# Patient Record
Sex: Female | Born: 1965 | Race: White | Hispanic: No | Marital: Married | Smoking: Never smoker
Health system: Southern US, Community
[De-identification: ages and names within clinical notes are randomized; demographics above are authoritative.]

## PROBLEM LIST (undated history)

## (undated) DIAGNOSIS — G43909 Migraine, unspecified, not intractable, without status migrainosus: Secondary | ICD-10-CM

## (undated) DIAGNOSIS — I10 Essential (primary) hypertension: Secondary | ICD-10-CM

## (undated) DIAGNOSIS — Z8719 Personal history of other diseases of the digestive system: Secondary | ICD-10-CM

## (undated) DIAGNOSIS — F419 Anxiety disorder, unspecified: Secondary | ICD-10-CM

## (undated) DIAGNOSIS — E785 Hyperlipidemia, unspecified: Secondary | ICD-10-CM

## (undated) DIAGNOSIS — K219 Gastro-esophageal reflux disease without esophagitis: Secondary | ICD-10-CM

## (undated) HISTORY — PX: ESOPHAGOGASTRODUODENOSCOPY: SHX1529

## (undated) HISTORY — PX: LAPAROSCOPIC HYSTERECTOMY: SHX1926

---

## 2006-05-21 ENCOUNTER — Emergency Department (HOSPITAL_COMMUNITY): Admission: EM | Admit: 2006-05-21 | Discharge: 2006-05-21 | Payer: Self-pay | Admitting: Family Medicine

## 2006-06-19 ENCOUNTER — Emergency Department (HOSPITAL_COMMUNITY): Admission: EM | Admit: 2006-06-19 | Discharge: 2006-06-19 | Payer: Self-pay | Admitting: Emergency Medicine

## 2006-06-21 ENCOUNTER — Emergency Department (HOSPITAL_COMMUNITY): Admission: EM | Admit: 2006-06-21 | Discharge: 2006-06-21 | Payer: Self-pay | Admitting: Family Medicine

## 2006-07-30 ENCOUNTER — Emergency Department (HOSPITAL_COMMUNITY): Admission: EM | Admit: 2006-07-30 | Discharge: 2006-07-30 | Payer: Self-pay | Admitting: Family Medicine

## 2007-03-16 DIAGNOSIS — I10 Essential (primary) hypertension: Secondary | ICD-10-CM | POA: Insufficient documentation

## 2008-02-09 ENCOUNTER — Emergency Department (HOSPITAL_COMMUNITY): Admission: EM | Admit: 2008-02-09 | Discharge: 2008-02-09 | Payer: Self-pay | Admitting: Emergency Medicine

## 2009-07-12 ENCOUNTER — Encounter: Admission: RE | Admit: 2009-07-12 | Discharge: 2009-07-12 | Payer: Self-pay | Admitting: Family Medicine

## 2009-08-03 ENCOUNTER — Ambulatory Visit (HOSPITAL_COMMUNITY): Admission: RE | Admit: 2009-08-03 | Discharge: 2009-08-03 | Payer: Self-pay | Admitting: Obstetrics & Gynecology

## 2010-12-18 LAB — TYPE AND SCREEN: Antibody Screen: NEGATIVE

## 2010-12-18 LAB — CBC
HCT: 33.5 % — ABNORMAL LOW (ref 36.0–46.0)
Hemoglobin: 11 g/dL — ABNORMAL LOW (ref 12.0–15.0)
MCV: 76 fL — ABNORMAL LOW (ref 78.0–100.0)
Platelets: 284 10*3/uL (ref 150–400)
RDW: 16.9 % — ABNORMAL HIGH (ref 11.5–15.5)
WBC: 5.8 10*3/uL (ref 4.0–10.5)

## 2010-12-18 LAB — BASIC METABOLIC PANEL
BUN: 10 mg/dL (ref 6–23)
Chloride: 100 mEq/L (ref 96–112)
Glucose, Bld: 100 mg/dL — ABNORMAL HIGH (ref 70–99)
Potassium: 3.7 mEq/L (ref 3.5–5.1)
Sodium: 135 mEq/L (ref 135–145)

## 2011-03-24 ENCOUNTER — Other Ambulatory Visit: Payer: Self-pay | Admitting: Physician Assistant

## 2011-03-24 ENCOUNTER — Other Ambulatory Visit (HOSPITAL_COMMUNITY)
Admission: RE | Admit: 2011-03-24 | Discharge: 2011-03-24 | Disposition: A | Discharge status: Home / Self Care | Payer: BC Managed Care – PPO | Source: Ambulatory Visit | Attending: Family Medicine | Admitting: Family Medicine

## 2011-03-24 DIAGNOSIS — Z0000 Encounter for general adult medical examination without abnormal findings: Secondary | ICD-10-CM | POA: Insufficient documentation

## 2011-04-21 ENCOUNTER — Other Ambulatory Visit: Payer: Self-pay | Admitting: Physician Assistant

## 2011-06-11 LAB — POCT I-STAT, CHEM 8
BUN: 10
Calcium, Ion: 1.14
Chloride: 106
Creatinine, Ser: 0.9
Glucose, Bld: 83
Potassium: 3.8

## 2011-06-11 LAB — CBC
Hemoglobin: 12.7
MCHC: 33.6
RBC: 4.54
WBC: 7.4

## 2011-06-11 LAB — DIFFERENTIAL
Basophils Relative: 0
Lymphs Abs: 1.4
Monocytes Absolute: 0.3
Monocytes Relative: 5
Neutro Abs: 5.5
Neutrophils Relative %: 74

## 2014-12-18 ENCOUNTER — Emergency Department (HOSPITAL_COMMUNITY): Payer: BC Managed Care – PPO

## 2014-12-18 ENCOUNTER — Emergency Department (HOSPITAL_COMMUNITY)
Admission: EM | Admit: 2014-12-18 | Discharge: 2014-12-18 | Disposition: A | Discharge status: Home / Self Care | Payer: BC Managed Care – PPO | Attending: Emergency Medicine | Admitting: Emergency Medicine

## 2014-12-18 ENCOUNTER — Encounter (HOSPITAL_COMMUNITY): Payer: Self-pay | Admitting: *Deleted

## 2014-12-18 DIAGNOSIS — Z79899 Other long term (current) drug therapy: Secondary | ICD-10-CM | POA: Diagnosis not present

## 2014-12-18 DIAGNOSIS — R51 Headache: Secondary | ICD-10-CM | POA: Diagnosis present

## 2014-12-18 DIAGNOSIS — E785 Hyperlipidemia, unspecified: Secondary | ICD-10-CM | POA: Diagnosis not present

## 2014-12-18 DIAGNOSIS — I10 Essential (primary) hypertension: Secondary | ICD-10-CM | POA: Diagnosis not present

## 2014-12-18 DIAGNOSIS — G43809 Other migraine, not intractable, without status migrainosus: Secondary | ICD-10-CM | POA: Diagnosis not present

## 2014-12-18 DIAGNOSIS — G43909 Migraine, unspecified, not intractable, without status migrainosus: Secondary | ICD-10-CM | POA: Diagnosis not present

## 2014-12-18 DIAGNOSIS — F419 Anxiety disorder, unspecified: Secondary | ICD-10-CM | POA: Diagnosis not present

## 2014-12-18 HISTORY — DX: Migraine, unspecified, not intractable, without status migrainosus: G43.909

## 2014-12-18 HISTORY — DX: Hyperlipidemia, unspecified: E78.5

## 2014-12-18 HISTORY — DX: Essential (primary) hypertension: I10

## 2014-12-18 HISTORY — DX: Anxiety disorder, unspecified: F41.9

## 2014-12-18 LAB — COMPREHENSIVE METABOLIC PANEL
ALT: 25 U/L (ref 0–35)
ANION GAP: 9 (ref 5–15)
AST: 18 U/L (ref 0–37)
Albumin: 4.5 g/dL (ref 3.5–5.2)
Alkaline Phosphatase: 90 U/L (ref 39–117)
BUN: 18 mg/dL (ref 6–23)
CALCIUM: 9.4 mg/dL (ref 8.4–10.5)
CO2: 29 mmol/L (ref 19–32)
CREATININE: 0.87 mg/dL (ref 0.50–1.10)
Chloride: 100 mmol/L (ref 96–112)
GFR calc Af Amer: >90 mL/min (ref >90)
GFR calc non Af Amer: 78 mL/min — ABNORMAL LOW (ref >90)
Glucose, Bld: 93 mg/dL (ref 70–99)
Potassium: 3.3 mmol/L — ABNORMAL LOW (ref 3.5–5.1)
Sodium: 138 mmol/L (ref 135–145)
Total Bilirubin: 0.5 mg/dL (ref 0.3–1.2)
Total Protein: 7.6 g/dL (ref 6.0–8.3)

## 2014-12-18 LAB — CBG MONITORING, ED
Glucose-Capillary: 80 mg/dL (ref 70–99)
Glucose-Capillary: 89 mg/dL (ref 70–99)

## 2014-12-18 LAB — PROTIME-INR
INR: 0.94 (ref 0.00–1.49)
Prothrombin Time: 12.7 seconds (ref 11.6–15.2)

## 2014-12-18 LAB — CBC
HCT: 43.3 % (ref 36.0–46.0)
HEMOGLOBIN: 14.8 g/dL (ref 12.0–15.0)
MCH: 29.2 pg (ref 26.0–34.0)
MCHC: 34.2 g/dL (ref 30.0–36.0)
MCV: 85.4 fL (ref 78.0–100.0)
Platelets: 227 10*3/uL (ref 150–400)
RBC: 5.07 MIL/uL (ref 3.87–5.11)
RDW: 12.9 % (ref 11.5–15.5)
WBC: 7.5 10*3/uL (ref 4.0–10.5)

## 2014-12-18 LAB — APTT: APTT: 29 s (ref 24–37)

## 2014-12-18 LAB — DIFFERENTIAL
Basophils Absolute: 0 10*3/uL (ref 0.0–0.1)
Basophils Relative: 0 % (ref 0–1)
Eosinophils Absolute: 0.2 10*3/uL (ref 0.0–0.7)
Eosinophils Relative: 3 % (ref 0–5)
Lymphocytes Relative: 30 % (ref 12–46)
Lymphs Abs: 2.3 10*3/uL (ref 0.7–4.0)
MONO ABS: 0.4 10*3/uL (ref 0.1–1.0)
Monocytes Relative: 5 % (ref 3–12)
Neutro Abs: 4.7 10*3/uL (ref 1.7–7.7)
Neutrophils Relative %: 62 % (ref 43–77)

## 2014-12-18 LAB — I-STAT TROPONIN, ED: Troponin i, poc: 0 ng/mL (ref 0.00–0.08)

## 2014-12-18 MED ORDER — MORPHINE SULFATE 4 MG/ML IJ SOLN
4.0000 mg | Freq: Once | INTRAMUSCULAR | Status: AC
  Administered 2014-12-18: 4 mg via INTRAVENOUS
  Filled 2014-12-18 (×2): qty 1

## 2014-12-18 MED ORDER — GADOBENATE DIMEGLUMINE 529 MG/ML IV SOLN
20.0000 mL | Freq: Once | INTRAVENOUS | Status: AC | PRN
  Administered 2014-12-18: 20 mL via INTRAVENOUS

## 2014-12-18 MED ORDER — DIPHENHYDRAMINE HCL 50 MG/ML IJ SOLN
25.0000 mg | Freq: Once | INTRAMUSCULAR | Status: AC
  Administered 2014-12-18: 25 mg via INTRAVENOUS
  Filled 2014-12-18: qty 1

## 2014-12-18 MED ORDER — SODIUM CHLORIDE 0.9 % IV BOLUS (SEPSIS)
1000.0000 mL | Freq: Once | INTRAVENOUS | Status: AC
  Administered 2014-12-18: 1000 mL via INTRAVENOUS

## 2014-12-18 MED ORDER — PROCHLORPERAZINE EDISYLATE 5 MG/ML IJ SOLN
10.0000 mg | Freq: Once | INTRAMUSCULAR | Status: AC
  Administered 2014-12-18: 10 mg via INTRAVENOUS
  Filled 2014-12-18: qty 2

## 2014-12-18 MED ORDER — KETOROLAC TROMETHAMINE 30 MG/ML IJ SOLN
30.0000 mg | Freq: Once | INTRAMUSCULAR | Status: AC
  Administered 2014-12-18: 30 mg via INTRAVENOUS
  Filled 2014-12-18: qty 1

## 2014-12-18 MED ORDER — METOCLOPRAMIDE HCL 5 MG/ML IJ SOLN
10.0000 mg | Freq: Once | INTRAMUSCULAR | Status: AC
  Administered 2014-12-18: 10 mg via INTRAVENOUS
  Filled 2014-12-18: qty 2

## 2014-12-18 MED ORDER — LABETALOL HCL 5 MG/ML IV SOLN
10.0000 mg | Freq: Once | INTRAVENOUS | Status: AC
  Administered 2014-12-18: 10 mg via INTRAVENOUS
  Filled 2014-12-18: qty 4

## 2014-12-18 NOTE — ED Notes (Signed)
Code stroke called

## 2014-12-18 NOTE — ED Notes (Signed)
LSN [id number]

## 2014-12-18 NOTE — Discharge Instructions (Signed)
Your MRI showed some changes that can be seen with pseudotumor cerebri. Please follow up with your Neurologist as scheduled.     Hypertension Hypertension, commonly called high blood pressure, is when the force of blood pumping through your arteries is too strong. Your arteries are the blood vessels that carry blood from your heart throughout your body. A blood pressure reading consists of a higher number over a lower number, such as 110/72. The higher number (systolic) is the pressure inside your arteries when your heart pumps. The lower number (diastolic) is the pressure inside your arteries when your heart relaxes. Ideally you want your blood pressure below 120/80. Hypertension forces your heart to work harder to pump blood. Your arteries may become narrow or stiff. Having hypertension puts you at risk for heart disease, stroke, and other problems.  RISK FACTORS Some risk factors for high blood pressure are controllable. Others are not.  Risk factors you cannot control include:   Race. You may be at higher risk if you are African American.  Age. Risk increases with age.  Gender. Men are at higher risk than women before age 38 years. After age 62, women are at higher risk than men. Risk factors you can control include:  Not getting enough exercise or physical activity.  Being overweight.  Getting too much fat, sugar, calories, or salt in your diet.  Drinking too much alcohol. SIGNS AND SYMPTOMS Hypertension does not usually cause signs or symptoms. Extremely high blood pressure (hypertensive crisis) may cause headache, anxiety, shortness of breath, and nosebleed. DIAGNOSIS  To check if you have hypertension, your health care provider will measure your blood pressure while you are seated, with your arm held at the level of your heart. It should be measured at least twice using the same arm. Certain conditions can cause a difference in blood pressure between your right and left arms. A  blood pressure reading that is higher than normal on one occasion does not mean that you need treatment. If one blood pressure reading is high, ask your health care provider about having it checked again. TREATMENT  Treating high blood pressure includes making lifestyle changes and possibly taking medicine. Living a healthy lifestyle can help lower high blood pressure. You may need to change some of your habits. Lifestyle changes may include:  Following the DASH diet. This diet is high in fruits, vegetables, and whole grains. It is low in salt, red meat, and added sugars.  Getting at least 2 hours of brisk physical activity every week.  Losing weight if necessary.  Not smoking.  Limiting alcoholic beverages.  Learning ways to reduce stress. If lifestyle changes are not enough to get your blood pressure under control, your health care provider may prescribe medicine. You may need to take more than one. Work closely with your health care provider to understand the risks and benefits. HOME CARE INSTRUCTIONS  Have your blood pressure rechecked as directed by your health care provider.   Take medicines only as directed by your health care provider. Follow the directions carefully. Blood pressure medicines must be taken as prescribed. The medicine does not work as well when you skip doses. Skipping doses also puts you at risk for problems.   Do not smoke.   Monitor your blood pressure at home as directed by your health care provider. SEEK MEDICAL CARE IF:   You think you are having a reaction to medicines taken.  You have recurrent headaches or feel dizzy.  You have  swelling in your ankles.  You have trouble with your vision. SEEK IMMEDIATE MEDICAL CARE IF:  You develop a severe headache or confusion.  You have unusual weakness, numbness, or feel faint.  You have severe chest or abdominal pain.  You vomit repeatedly.  You have trouble breathing. MAKE SURE YOU:    Understand these instructions.  Will watch your condition.  Will get help right away if you are not doing well or get worse. Document Released: 09/01/2005 Document Revised: 01/16/2014 Document Reviewed: 06/24/2013 [facility] Patient Information 2015 [place], [place]. This information is not intended to replace advice given to you by your health care provider. Make sure you discuss any questions you have with your health care provider.  Migraine Headache A migraine headache is an intense, throbbing pain on one or both sides of your head. A migraine can last for 30 minutes to several hours. CAUSES  The exact cause of a migraine headache is not always known. However, a migraine may be caused when nerves in the brain become irritated and release chemicals that cause inflammation. This causes pain. Certain things may also trigger migraines, such as:  Alcohol.  Smoking.  Stress.  Menstruation.  Aged cheeses.  Foods or drinks that contain nitrates, glutamate, aspartame, or tyramine.  Lack of sleep.  Chocolate.  Caffeine.  Hunger.  Physical exertion.  Fatigue.  Medicines used to treat chest pain (nitroglycerine), birth control pills, estrogen, and some blood pressure medicines. SIGNS AND SYMPTOMS  Pain on one or both sides of your head.  Pulsating or throbbing pain.  Severe pain that prevents daily activities.  Pain that is aggravated by any physical activity.  Nausea, vomiting, or both.  Dizziness.  Pain with exposure to bright lights, loud noises, or activity.  General sensitivity to bright lights, loud noises, or smells. Before you get a migraine, you may get warning signs that a migraine is coming (aura). An aura may include:  Seeing flashing lights.  Seeing bright spots, halos, or zigzag lines.  Having tunnel vision or blurred vision.  Having feelings of numbness or tingling.  Having trouble talking.  Having muscle weakness. DIAGNOSIS  A migraine  headache is often diagnosed based on:  Symptoms.  Physical exam.  A CT scan or MRI of your head. These imaging tests cannot diagnose migraines, but they can help rule out other causes of headaches. TREATMENT Medicines may be given for pain and nausea. Medicines can also be given to help prevent recurrent migraines.  HOME CARE INSTRUCTIONS  Only take over-the-counter or prescription medicines for pain or discomfort as directed by your health care provider. The use of long-term narcotics is not recommended.  Lie down in a dark, quiet room when you have a migraine.  Keep a journal to find out what may trigger your migraine headaches. For example, write down:  What you eat and drink.  How much sleep you get.  Any change to your diet or medicines.  Limit alcohol consumption.  Quit smoking if you smoke.  Get 7-9 hours of sleep, or as recommended by your health care provider.  Limit stress.  Keep lights dim if bright lights bother you and make your migraines worse. SEEK IMMEDIATE MEDICAL CARE IF:   Your migraine becomes severe.  You have a fever.  You have a stiff neck.  You have vision loss.  You have muscular weakness or loss of muscle control.  You start losing your balance or have trouble walking.  You feel faint or pass out.  You have severe symptoms that are different from your first symptoms. MAKE SURE YOU:   Understand these instructions.  Will watch your condition.  Will get help right away if you are not doing well or get worse. Document Released: 09/01/2005 Document Revised: 01/16/2014 Document Reviewed: 05/09/2013 [facility] Patient Information 2015 [place], [place]. This information is not intended to replace advice given to you by your health care provider. Make sure you discuss any questions you have with your health care provider. Pseudotumor Cerebri Pseudotumor cerebri, also called idiopathic intracranial hypertension, is a condition that occurs due  to increased pressure within your skull. Although some of the symptoms resemble those of a brain tumor, it is not a brain tumor. Symptoms occur when the increased pressure in your skull compresses brain structures. For example, pressure on the nerve responsible for vision (optic nerve) causes it to swell, resulting in visual symptoms. Pseudotumor cerebri tends to occur in obese women younger than 47 years of age. However, men and children can also develop this condition. SYMPTOMS  Symptoms of pseudotumor cerebri occur due to increased pressure within the skull. Symptoms may include:   Headaches.  Nausea and vomiting.  Dizziness.  High blood pressure.   Ringing in the ears.  Double or blurred vision.  Brief episodes of complete loss of vision.  Pain in the back, neck, or shoulders. DIAGNOSIS  Pseudotumor cerebri is diagnosed through:  A detailed eye exam, which can reveal a swollen optic nerve, as well as identifying issues such as blind spots in the vision.  An MRI or CT scan to rule out other disorders that can cause similar symptoms, such as brain tumors.  A spinal tap (lumbar puncture), which can demonstrate increased pressure within the skull. TREATMENT  There are several ways that pseudotumor cerebri is treated, including:  Medicines to decrease the production of spinal fluid and lower the pressure within your skull.  Medicines to prevent or treat headaches.  Surgery to create an opening in your optic nerve to allow excess fluid to drain out.  Surgery to place drains (shunts) in your brain to remove excess fluid. HOME CARE INSTRUCTIONS   Take all medicines as directed by your health care provider.  Go to all of your follow-up appointments.  Lose weight if you are overweight. SEEK MEDICAL CARE IF:  Any symptoms come back.  You develop trouble with hearing, vision, balance, or your sense of smell.  You cannot eat or drink what you need.  You are more weak or  tired than usual.   You are losing weight without trying. SEEK IMMEDIATE MEDICAL CARE IF:  You have new symptoms such as vision problems or difficulty walking.   You have a seizure.   You have trouble breathing.   You have a fever.  Document Released: 09/06/2013 Document Reviewed: 09/06/2013 [facility] Patient Information 2015 [place], [place]. This information is not intended to replace advice given to you by your health care provider. Make sure you discuss any questions you have with your health care provider.

## 2014-12-18 NOTE — ED Provider Notes (Signed)
CSN: [id number]     Arrival date & time 12/18/14  83 History   First MD Initiated Contact with Patient 12/18/14 1436     Chief Complaint  Patient presents with  . Hypertension  . Altered Mental Status   Level V caveat: Alerted mental status.   Cynthia Tapia is a 49 y.o. female with a history of migraines, hypertension, anxiety and hyperlipidemia who presents to the ED initially complaining of a bad headache and high blood pressure. When I initially evaluated the patient her speech is garbled and she is unable to follow commands. She would not raise her arms. Her husband reports that she has had elevated blood pressures over the past 4 days and after arrival to the ED started not acting herself. He reports she has a history of migraines but has never had symptoms this bad before. She is complaining of her head hurting.  (Consider location/radiation/quality/duration/timing/severity/associated sxs/prior Treatment) HPI  Past Medical History  Diagnosis Date  . Migraine   . Hypertension   . Anxiety   . Hyperlipidemia    Past Surgical History  Procedure Laterality Date  . Abdominal hysterectomy      partial  . Cesarean section     Family History  Problem Relation Age of Onset  . Migraines Mother    History  Substance Use Topics  . Smoking status: Never Smoker   . Smokeless tobacco: Not on file  . Alcohol Use: Yes     Comment: occ   OB History    No data available     Review of Systems  Unable to perform ROS: Mental status change      Allergies  Review of patient's allergies indicates no known allergies.  Home Medications   Prior to Admission medications   Medication Sig Start Date End Date Taking? Authorizing Provider  baclofen (LIORESAL) 10 MG tablet Take 10 mg by mouth 3 (three) times daily as needed (headache).  12/14/14  Yes Historical Provider, MD  Cholecalciferol (VITAMIN D PO) Take 1 tablet by mouth daily.   Yes Historical Provider, MD  clonazePAM (KLONOPIN)  1 MG tablet Take 1 mg by mouth 2 (two) times daily.  12/15/14  Yes Historical Provider, MD  losartan-hydrochlorothiazide (HYZAAR) 100-12.5 MG per tablet Take 1 tablet by mouth daily. 09/26/14  Yes Historical Provider, MD  metoprolol succinate (TOPROL-XL) 50 MG 24 hr tablet Take 50 mg by mouth daily. 12/15/14  Yes Historical Provider, MD  Multiple Vitamins-Minerals (PRESERVISION AREDS 2 PO) Take 1 tablet by mouth 2 (two) times daily.   Yes Historical Provider, MD  Multiple Vitamins-Minerals (ZINC PO) Take 1 tablet by mouth daily.   Yes Historical Provider, MD  zonisamide (ZONEGRAN) 25 MG capsule Take 75 mg by mouth at bedtime. 12/14/14  Yes Historical Provider, MD   BP 156/87 mmHg  Pulse 72  Temp(Src) 98.4 F (36.9 C) (Oral)  Resp 13  SpO2 96% Physical Exam  Constitutional: She appears well-developed and well-nourished.  The patient is alert but not following commands. Her airway is intact and she is handling her sections.   HENT:  Head: Normocephalic and atraumatic.  Right Ear: External ear normal.  Left Ear: External ear normal.  Mouth/Throat: Oropharynx is clear and moist. No oropharyngeal exudate.  Eyes: Conjunctivae are normal. Pupils are equal, round, and reactive to light. Right eye exhibits no discharge. Left eye exhibits no discharge.  Neck: Neck supple. No JVD present. No tracheal deviation present.  Cardiovascular: Normal rate, regular rhythm, normal heart  sounds and intact distal pulses.  Exam reveals no gallop and no friction rub.   No murmur heard. Bilateral radial pulses are intact.   Pulmonary/Chest: Effort normal and breath sounds normal. No respiratory distress. She has no wheezes. She has no rales.  Abdominal: Soft. She exhibits no distension. There is no tenderness.  Musculoskeletal: She exhibits no edema.  Lymphadenopathy:    She has no cervical adenopathy.  Neurological: She is alert. Coordination abnormal.  Speech is incoherent. She has trouble following commands and  will not squeeze or move her right arm or hand.   Skin: Skin is warm and dry. No rash noted. She is not diaphoretic. No erythema. No pallor.  Psychiatric: Her behavior is normal. Her mood appears anxious.  She appears anxious and is tearful.   Nursing note and vitals reviewed.   ED Course  Procedures (including critical care time) Labs Review Labs Reviewed  COMPREHENSIVE METABOLIC PANEL - Abnormal; Notable for the following:    Potassium 3.3 (*)    GFR calc non Af Amer 78 (*)    All other components within normal limits  PROTIME-INR  APTT  CBC  DIFFERENTIAL  CBG MONITORING, ED  I-STAT TROPOININ, ED  CBG MONITORING, ED    Imaging Review Ct Head (brain) Wo Contrast  12/18/2014   CLINICAL DATA:  Acute onset slurred speech and headache  EXAM: CT HEAD WITHOUT CONTRAST  TECHNIQUE: Contiguous axial images were obtained from the base of the skull through the vertex without intravenous contrast.  COMPARISON:  None.  FINDINGS: The ventricles are normal in size and configuration. There is no mass, hemorrhage, extra-axial fluid collection, or midline shift. The gray-white compartments are normal. There is no demonstrable acute infarct. Neither middle cerebral artery shows significant increased attenuation. The bony calvarium appears intact. The mastoid air cells are clear.  IMPRESSION: No intracranial mass, hemorrhage, or focal gray -white compartment lesions/acute appearing infarct.  Critical Value/emergent results were called by telephone at the time of interpretation on 12/18/2014 at 2:53 pm to Dr. Leonel Ramsay, neurology, who verbally acknowledged these results.   Electronically Signed   By: Lowella Grip III M.D.   On: 12/18/2014 14:54     EKG Interpretation   Date/Time:  Monday December 18 2014 14:06:23 EDT Ventricular Rate:  70 PR Interval:  174 QRS Duration: 88 QT Interval:  372 QTC Calculation: 401 R Axis:   48 Text Interpretation:  Normal sinus rhythm Nonspecific T wave abnormality   Confirmed by Ashok Cordia  MD, KEVIN ([id number]) on 12/18/2014 4:31:08 PM     Filed Vitals:   12/18/14 1615 12/18/14 1630 12/18/14 1645 12/18/14 1715  BP: 161/92 164/102 156/87   Pulse: 64 67 63 72  Temp:      TempSrc:      Resp: 15 13 12 13   SpO2: 98% 98% 96% 96%    MDM   Meds given in ED:  Medications  prochlorperazine (COMPAZINE) injection 10 mg (10 mg Intravenous Given 12/18/14 1500)  ketorolac (TORADOL) 30 MG/ML injection 30 mg (30 mg Intravenous Given 12/18/14 1500)  labetalol (NORMODYNE,TRANDATE) injection 10 mg (10 mg Intravenous Given 12/18/14 1512)  morphine 4 MG/ML injection 4 mg (4 mg Intravenous Given 12/18/14 1725)    New Prescriptions   No medications on file    Final diagnoses:  Other migraine without status migrainosus, not intractable   This  is a 48 y.o. female with a history of migraines, hypertension, anxiety and hyperlipidemia who presents to the ED initially complaining of a  bad headache and high blood pressure. When I initially evaluated the patient her speech is garbled and she is unable to follow commands. She would not raise her arms. Her husband reports that she has had elevated blood pressures over the past 4 days and after arrival to the ED started not acting herself.  Code stroke was activated and she was taken to CT scan. Her airway was intact and she was alert.  Dr. Leonel Ramsay saw the patient and believes this to be a complex migraine. CT scan is negative. He recommends MRI, Toradol, compazine and labetalol for her hypertension. If MRI is negative she can be discharged and follow-up with her neurologist as an outpatient.  At my reevaluation the patient was much improved. She is no longer altered unable to follow commands. She is alert and oriented 4. She has no focal neuro deficits on my second evaluation. She reports her headache has improved. Will add morphine for further pain control. She tells me that her ophthalmologist had ordered MRI of her brain for optic  nerve swelling. I expect her repeating an MRI here today and will be discharging her this was unremarkable. She is okay with this plan. Patient's blood pressure improved after labetalol.  Patient care handed off to Dr. Ralene Bathe at shift change.      Waynetta Pean, PA-C 12/18/14 [place], MD 12/18/14 [phone]

## 2014-12-18 NOTE — ED Notes (Signed)
Patient in CT

## 2014-12-18 NOTE — Consult Note (Signed)
Referring Physician: Ashok Cordia    Chief Complaint: Slurred speech  HPI:                                                                                                                                         Cynthia Tapia is an 49 y.o. female with known history of migraines and currently involved with out patient neurology due to migraines and further work up of possible pseudotumor cerebri. Patient states she had a recent change in her BP medications and has noted her BP has been elevated since Thursday. Over the weekend she has been taking both Baclofen and Klonopin together for her HA.  Husband has noted that when she takes the medication her speech at times will be slurred.  This AM she took her medication at 0730.  The slurred speech was not noted until 1300 hours.  She notes that she will get a aura prior to her Migraines consisting of slow speech and "inverted tunnel vision".  Currently she has a HA rated 8/10 in the back of her head. No blurred vision, double vision, nausea or vomiting. No numbness or tingling.   Date last known well: Date: 12/18/2014 Time last known well: Time: 13:45 tPA Given: No: minimal symptoms and actively improving  Modified Rankin: Rankin Score=0    Past Medical History  Diagnosis Date  . Migraine   . Hypertension   . Anxiety   . Hyperlipidemia     Past Surgical History  Procedure Laterality Date  . Abdominal hysterectomy      partial  . Cesarean section      Family History  Problem Relation Age of Onset  . Migraines Mother    Social History:  reports that she has never smoked. She does not have any smokeless tobacco history on file. She reports that she drinks alcohol. She reports that she does not use illicit drugs.  Allergies: No Known Allergies  Medications:                                                                                                                           No current facility-administered medications for this encounter.    Current Outpatient Prescriptions  Medication Sig Dispense Refill  . baclofen (LIORESAL) 10 MG tablet Take 10 mg by mouth 3 (three) times daily as needed (headache).     . Cholecalciferol (  VITAMIN D PO) Take 1 tablet by mouth daily.    . clonazePAM (KLONOPIN) 1 MG tablet Take 1 mg by mouth 2 (two) times daily.     Marland Kitchen losartan-hydrochlorothiazide (HYZAAR) 100-12.5 MG per tablet Take 1 tablet by mouth daily.    . metoprolol succinate (TOPROL-XL) 50 MG 24 hr tablet Take 50 mg by mouth daily.    . Multiple Vitamins-Minerals (PRESERVISION AREDS 2 PO) Take 1 tablet by mouth 2 (two) times daily.    . Multiple Vitamins-Minerals (ZINC PO) Take 1 tablet by mouth daily.    Marland Kitchen zonisamide (ZONEGRAN) 25 MG capsule Take 75 mg by mouth at bedtime.       ROS:                                                                                                                                       History obtained from the patient  General ROS: negative for - chills, fatigue, fever, night sweats, weight gain or weight loss Psychological ROS: negative for - behavioral disorder, hallucinations, memory difficulties, mood swings or suicidal ideation Ophthalmic ROS: negative for - blurry vision, double vision, eye pain or loss of vision ENT ROS: negative for - epistaxis, nasal discharge, oral lesions, sore throat, tinnitus or vertigo Allergy and Immunology ROS: negative for - hives or itchy/watery eyes Hematological and Lymphatic ROS: negative for - bleeding problems, bruising or swollen lymph nodes Endocrine ROS: negative for - galactorrhea, hair pattern changes, polydipsia/polyuria or temperature intolerance Respiratory ROS: negative for - cough, hemoptysis, shortness of breath or wheezing Cardiovascular ROS: negative for - chest pain, dyspnea on exertion, edema or irregular heartbeat Gastrointestinal ROS: negative for - abdominal pain, diarrhea, hematemesis, nausea/vomiting or stool incontinence Genito-Urinary ROS:  negative for - dysuria, hematuria, incontinence or urinary frequency/urgency Musculoskeletal ROS: negative for - joint swelling or muscular weakness Neurological ROS: as noted in HPI Dermatological ROS: negative for rash and skin lesion changes  Neurologic Examination:                                                                                                      Blood pressure 215/119, pulse 78, temperature 98.4 F (36.9 C), temperature source Oral, resp. rate 11, SpO2 100 %.  HEENT-  Normocephalic, no lesions, without obvious abnormality.  Normal external eye and conjunctiva.  Normal TM's bilaterally.  Normal auditory canals and external ears. Normal external nose, mucus membranes and septum.  Normal pharynx. Cardiovascular- S1, S2 normal, pulses palpable throughout  Lungs- chest clear, no wheezing, rales, normal symmetric air entry Abdomen- normal findings: bowel sounds normal Extremities- no edema Lymph-no adenopathy palpable Musculoskeletal-no joint tenderness, deformity or swelling Skin-warm and dry, no hyperpigmentation, vitiligo, or suspicious lesions  Neurological Examination Mental Status: Alert, oriented, thought content appropriate.  Speech slow but  fluent without evidence of aphasia.  Able to follow 3 step commands without difficulty. Cranial Nerves: II: Discs flat bilaterally; Visual fields grossly normal, pupils equal, round, reactive to light and accommodation III,IV, VI: ptosis not present, extra-ocular motions intact bilaterally V,VII: smile symmetric, facial light touch sensation normal bilaterally VIII: hearing normal bilaterally IX,X: uvula rises symmetrically XI: bilateral shoulder shrug XII: midline tongue extension Motor: Right : Upper extremity   5/5    Left:     Upper extremity   5/5  Lower extremity   5/5     Lower extremity   5/5 Tone and bulk:normal tone throughout; no atrophy noted Sensory: Pinprick and light touch intact throughout,  bilaterally Deep Tendon Reflexes: 2+ and symmetric throughout Plantars: Right: downgoing   Left: downgoing Cerebellar: normal finger-to-nose and normal heel-to-shin test Gait: not tested due to patient safety       Lab Results: Basic Metabolic Panel: No results for input(s): NA, K, CL, CO2, GLUCOSE, BUN, CREATININE, CALCIUM, MG, PHOS in the last 168 hours.  Liver Function Tests: No results for input(s): AST, ALT, ALKPHOS, BILITOT, PROT, ALBUMIN in the last 168 hours. No results for input(s): LIPASE, AMYLASE in the last 168 hours. No results for input(s): AMMONIA in the last 168 hours.  CBC:  Recent Labs Lab 12/18/14 1446  WBC 7.5  NEUTROABS 4.7  HGB 14.8  HCT 43.3  MCV 85.4  PLT 227    Cardiac Enzymes: No results for input(s): CKTOTAL, CKMB, CKMBINDEX, TROPONINI in the last 168 hours.  Lipid Panel: No results for input(s): CHOL, TRIG, HDL, CHOLHDL, VLDL, LDLCALC in the last 168 hours.  CBG: No results for input(s): GLUCAP in the last 168 hours.  Microbiology: No results found for this or any previous visit.  Coagulation Studies: No results for input(s): LABPROT, INR in the last 72 hours.  Imaging: Ct Head (brain) Wo Contrast  12/18/2014   CLINICAL DATA:  Acute onset slurred speech and headache  EXAM: CT HEAD WITHOUT CONTRAST  TECHNIQUE: Contiguous axial images were obtained from the base of the skull through the vertex without intravenous contrast.  COMPARISON:  None.  FINDINGS: The ventricles are normal in size and configuration. There is no mass, hemorrhage, extra-axial fluid collection, or midline shift. The gray-white compartments are normal. There is no demonstrable acute infarct. Neither middle cerebral artery shows significant increased attenuation. The bony calvarium appears intact. The mastoid air cells are clear.  IMPRESSION: No intracranial mass, hemorrhage, or focal gray -white compartment lesions/acute appearing infarct.  Critical Value/emergent results  were called by telephone at the time of interpretation on 12/18/2014 at 2:53 pm to Dr. Leonel Ramsay, neurology, who verbally acknowledged these results.   Electronically Signed   By: Lowella Grip III M.D.   On: 12/18/2014 14:54       Assessment and plan discussed with with attending physician and they are in agreement.    Etta Quill PA-C [facility] [phone]  12/18/2014, 3:23 PM     Stroke Risk Factors - hyperlipidemia and hypertension    Assessment: 49 y.o. female with mild rapidly improving symptoms. I suspect that this represents complicated migraine, but she will need to be evaluated for other causes as  well with MRI brain. This was scheduled as an outpatient, but should be performed while she is here to exclude stroke.   She is also being worked up as an outpatient for possible optic nerve edema. This could be completed as scheduled.   1) MRI brain 2) Compazine + toradol for migraine 3) Further workup only if mri is positive.   Roland Rack, MD [facility] [phone]  If 7pm- 7am, please page neurology on call as listed in [place].

## 2014-12-18 NOTE — Code Documentation (Signed)
49yo female arriving to [facility] via private vehicle at 1302.  Patient reporting h/o migraine with aura and reports headache that is worse today.  Patient also has a h/o hypertension and has not uncontrolled BP for several days.  Patient with recent changes to BP and migraine medications.  Patient's husband reports that patient has been having slurred speech since starting Baclofen over the weekend.  Patient with slurred speech today since 1345.  Code Stroke activated.  Patient to CT.  Stroke team to the bedside.  NIHSS 0, see documentation for details and code stroke times.  Patient with slow speech on exam.  Dr. Leonel Ramsay at the bedside, orders for medication to treat headache and BP.  No acute stroke treatment at this time.  Code stroke canceled at 1502.  Bedside handoff with ED RN Carmelina Paddock.

## 2014-12-18 NOTE — ED Notes (Signed)
Pt has multiple symptoms.  Pt has had headache for a long time and states it is worse today.  She points to right top and back of head.  Pt has history htn and was on medications but has not been controlled.  Pt is on losartan and metoprolol.  In addition she was started on baclofen Friday and was replacement for otc headache medications.  Pt took baclofen and klonopin at 0730.  Pt has slurry speech but they think from medication.  PT follows commands and states at school her BP 220/130

## 2015-08-08 ENCOUNTER — Ambulatory Visit (INDEPENDENT_AMBULATORY_CARE_PROVIDER_SITE_OTHER): Payer: BC Managed Care – PPO

## 2015-08-08 ENCOUNTER — Encounter: Payer: Self-pay | Admitting: Podiatry

## 2015-08-08 ENCOUNTER — Ambulatory Visit (INDEPENDENT_AMBULATORY_CARE_PROVIDER_SITE_OTHER): Payer: BC Managed Care – PPO | Admitting: Podiatry

## 2015-08-08 VITALS — BP 166/112 | HR 68 | Resp 16

## 2015-08-08 DIAGNOSIS — M7662 Achilles tendinitis, left leg: Secondary | ICD-10-CM | POA: Diagnosis not present

## 2015-08-08 DIAGNOSIS — M722 Plantar fascial fibromatosis: Secondary | ICD-10-CM | POA: Diagnosis not present

## 2015-08-08 MED ORDER — TRIAMCINOLONE ACETONIDE 10 MG/ML IJ SUSP
10.0000 mg | Freq: Once | INTRAMUSCULAR | Status: AC
  Administered 2015-08-08: 10 mg

## 2015-08-08 NOTE — Patient Instructions (Signed)

## 2015-08-09 NOTE — Progress Notes (Signed)
Subjective:     Patient ID: ALYRICA SEMON, female   DOB: October 05, 1965, 49 y.o.   MRN: [id number]  HPI patient presents with quite a bit of discomfort in the posterior aspect of the left heel of 1-1/2 year duration with worsening over the last 6 months. Patient's tried stretching and ice and does not wear shoes that press against the area and it's becoming gradually more intense and making it increasingly difficult for her to be active   Review of Systems  All other systems reviewed and are negative.      Objective:   Physical Exam  Constitutional: She is oriented to person, place, and time.  Cardiovascular: Intact distal pulses.   Musculoskeletal: Normal range of motion.  Neurological: She is oriented to person, place, and time.  Skin: Skin is warm.  Nursing note and vitals reviewed.  neurovascular status intact muscle strength adequate range of motion found to be within normal limits. Patient's noted to have significant inflammatory changes in the posterior aspect left heel lateral side with a central and medial side doing well. Patient does have moderate equinus condition is noted to be well oriented 3 and has good digital perfusion     Assessment:     Achilles tendinitis chronic in nature with acute inflammation noted on the left lateral side at the insertion    Plan:     H&P condition reviewed and explanation given. At this time also reviewed x-rays indicating small spur and today I went ahead and I discussed injection and risk of rupture associated with it. Patient wants procedure understanding risk and today I did a careful injection lateral side at insertion 3 mg of dexamethasone Kenalog 5 mg Xylocaine and then applied boot immobilization with cam walker and instructed on reduced activity utilization of Cam Walker ice therapy stretching exercises oral anti-inflammatory consisting of diclofenac and will reappoint again in 3 weeks. Discussed possibilities long-term for shockwave  therapy

## 2015-08-17 ENCOUNTER — Telehealth: Payer: Self-pay | Admitting: *Deleted

## 2015-08-17 NOTE — Telephone Encounter (Signed)
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  I told pt to remain in the boot, rest, ice and elevate, take OTC pain reliever she could tolerate and make an appt to come in sooner. While I spoke with pt the phone she was on crackled and broke connection several times before shutting off.  I gave Priscille Loveless our scheduler the pt's name to contact for an appt.

## 2015-08-20 ENCOUNTER — Ambulatory Visit (INDEPENDENT_AMBULATORY_CARE_PROVIDER_SITE_OTHER): Payer: BC Managed Care – PPO | Admitting: Podiatry

## 2015-08-20 ENCOUNTER — Ambulatory Visit (INDEPENDENT_AMBULATORY_CARE_PROVIDER_SITE_OTHER): Payer: BC Managed Care – PPO

## 2015-08-20 ENCOUNTER — Encounter: Payer: Self-pay | Admitting: Podiatry

## 2015-08-20 DIAGNOSIS — M7662 Achilles tendinitis, left leg: Secondary | ICD-10-CM | POA: Diagnosis not present

## 2015-08-20 DIAGNOSIS — Z8739 Personal history of other diseases of the musculoskeletal system and connective tissue: Secondary | ICD-10-CM | POA: Diagnosis not present

## 2015-08-20 DIAGNOSIS — Z87828 Personal history of other (healed) physical injury and trauma: Secondary | ICD-10-CM

## 2015-08-21 NOTE — Progress Notes (Signed)
Subjective:     Patient ID: Cynthia Tapia, female   DOB: May 26, 1966, 49 y.o.   MRN: [id number]  HPI patient states that she injured her left foot that work on Friday and that it's been very sore since and is hard for her to walk with   Review of Systems     Objective:   Physical Exam Neurovascular status was found to be intact muscle strength adequate with mild discomfort posterior aspect of the left heel that's not significant at the current time    Assessment:     Inflammatory tendinitis left    Plan:     Advised on ice therapy stretching exercises and shoe gear modification and reappoint if symptoms persist. Reviewed x-rays with patient

## 2015-08-30 ENCOUNTER — Encounter: Payer: Self-pay | Admitting: Podiatry

## 2015-08-30 ENCOUNTER — Ambulatory Visit (INDEPENDENT_AMBULATORY_CARE_PROVIDER_SITE_OTHER): Payer: BC Managed Care – PPO | Admitting: Podiatry

## 2015-08-30 ENCOUNTER — Ambulatory Visit: Payer: BC Managed Care – PPO | Admitting: Podiatry

## 2015-08-30 VITALS — BP 137/97 | HR 88 | Resp 16

## 2015-08-30 DIAGNOSIS — Z8739 Personal history of other diseases of the musculoskeletal system and connective tissue: Secondary | ICD-10-CM

## 2015-08-30 DIAGNOSIS — M7662 Achilles tendinitis, left leg: Secondary | ICD-10-CM

## 2015-08-30 DIAGNOSIS — Z87828 Personal history of other (healed) physical injury and trauma: Secondary | ICD-10-CM

## 2015-08-31 NOTE — Progress Notes (Signed)
Subjective:     Patient ID: Cynthia Tapia, female   DOB: [birth year], 49 y.o.   MRN: [id number]  HPI patient states the foot is feeling much better and the boot is been very effective and I'm not having to wear all the time   Review of Systems     Objective:   Physical Exam Neurovascular status intact muscle strength adequate range of motion within normal limits with significant diminishment of discomfort upon palpation    Assessment:     Improve tendinitis    Plan:     Advised on physical therapy and anti-inflammatories continued boot usage with gradual reduction over the next few weeks

## 2016-06-15 ENCOUNTER — Ambulatory Visit (HOSPITAL_COMMUNITY)
Admission: EM | Admit: 2016-06-15 | Discharge: 2016-06-15 | Disposition: A | Discharge status: Home / Self Care | Payer: BC Managed Care – PPO | Attending: Family Medicine | Admitting: Family Medicine

## 2016-06-15 ENCOUNTER — Encounter (HOSPITAL_COMMUNITY): Payer: Self-pay | Admitting: *Deleted

## 2016-06-15 DIAGNOSIS — F419 Anxiety disorder, unspecified: Secondary | ICD-10-CM | POA: Insufficient documentation

## 2016-06-15 DIAGNOSIS — R350 Frequency of micturition: Secondary | ICD-10-CM | POA: Insufficient documentation

## 2016-06-15 DIAGNOSIS — Z79899 Other long term (current) drug therapy: Secondary | ICD-10-CM | POA: Insufficient documentation

## 2016-06-15 DIAGNOSIS — I10 Essential (primary) hypertension: Secondary | ICD-10-CM | POA: Diagnosis not present

## 2016-06-15 DIAGNOSIS — N390 Urinary tract infection, site not specified: Secondary | ICD-10-CM | POA: Insufficient documentation

## 2016-06-15 DIAGNOSIS — E785 Hyperlipidemia, unspecified: Secondary | ICD-10-CM | POA: Diagnosis not present

## 2016-06-15 DIAGNOSIS — G43909 Migraine, unspecified, not intractable, without status migrainosus: Secondary | ICD-10-CM | POA: Insufficient documentation

## 2016-06-15 DIAGNOSIS — N3090 Cystitis, unspecified without hematuria: Secondary | ICD-10-CM

## 2016-06-15 LAB — POCT URINALYSIS DIP (DEVICE)
Bilirubin Urine: NEGATIVE
Glucose, UA: NEGATIVE mg/dL
Ketones, ur: NEGATIVE mg/dL
NITRITE: NEGATIVE
PH: 7 (ref 5.0–8.0)
Protein, ur: 100 mg/dL — AB
Specific Gravity, Urine: 1.02 (ref 1.005–1.030)
UROBILINOGEN UA: 1 mg/dL (ref 0.0–1.0)

## 2016-06-15 MED ORDER — CEPHALEXIN 500 MG PO CAPS: 500.0000 mg | ORAL_CAPSULE | Freq: Four times a day (QID) | ORAL | 0 refills | Status: DC

## 2016-06-15 NOTE — ED Provider Notes (Signed)
[place]    CSN: [id number] Arrival date & time: 06/15/16  1659     History   Chief Complaint Chief Complaint  Patient presents with  . Urinary Tract Infection    HPI Cynthia Tapia is a 50 y.o. female.   The history is provided by the patient.  Urinary Tract Infection  Pain quality:  Burning Pain severity:  Moderate Onset quality:  Sudden Duration:  1 hour Progression:  Worsening Chronicity:  Chronic Recent urinary tract infections: no   Relieved by:  None tried Worsened by:  Nothing Ineffective treatments:  None tried Urinary symptoms: frequent urination and hematuria   Associated symptoms: no abdominal pain, no fever, no flank pain, no nausea, no vaginal discharge and no vomiting   Risk factors: recurrent urinary tract infections     Past Medical History:  Diagnosis Date  . Anxiety   . Hyperlipidemia   . Hypertension   . Migraine     There are no active problems to display for this patient.   Past Surgical History:  Procedure Laterality Date  . ABDOMINAL HYSTERECTOMY     partial  . CESAREAN SECTION      OB History    No data available       Home Medications    Prior to Admission medications   Medication Sig Start Date End Date Taking? Authorizing Provider  acetaZOLAMIDE (DIAMOX) 125 MG tablet  07/30/15   Historical Provider, MD  baclofen (LIORESAL) 10 MG tablet Take 10 mg by mouth 3 (three) times daily as needed (headache).  12/14/14   Historical Provider, MD  Cholecalciferol (VITAMIN D PO) Take 1 tablet by mouth daily.    Historical Provider, MD  clonazePAM (KLONOPIN) 1 MG tablet TAKE 1 TABLET BY MOUTH EVERY 12 HOURS AS NEEDED FOR ANXIETY 12/15/14   Historical Provider, MD  DULoxetine (CYMBALTA) 30 MG capsule  08/06/15   Historical Provider, MD  fluconazole (DIFLUCAN) 150 MG tablet TAKE 1 TABLET BY MOUTH AS A SINGLE DOSE AS DIRECTED 06/21/15   Historical Provider, MD  losartan-hydrochlorothiazide (HYZAAR) 100-12.5 MG per tablet Take 1  tablet by mouth daily. 09/26/14   Historical Provider, MD  metoprolol succinate (TOPROL-XL) 50 MG 24 hr tablet Take 50 mg by mouth daily. 12/15/14   Historical Provider, MD  Multiple Vitamins-Minerals (PRESERVISION AREDS 2 PO) Take 1 tablet by mouth 2 (two) times daily.    Historical Provider, MD  Multiple Vitamins-Minerals (ZINC PO) Take 1 tablet by mouth daily.    Historical Provider, MD  SUMAtriptan (IMITREX) 100 MG tablet Take 100 mg by mouth. 04/06/15 04/05/16  Historical Provider, MD  Vitamin D, Ergocalciferol, (DRISDOL) 50000 UNITS CAPS capsule Take 50,000 Units by mouth once a week. 07/24/15   Historical Provider, MD  zonisamide (ZONEGRAN) 25 MG capsule Take 75 mg by mouth at bedtime. 12/14/14   Historical Provider, MD    Family History Family History  Problem Relation Age of Onset  . Migraines Mother     Social History Social History  Substance Use Topics  . Smoking status: Never Smoker  . Smokeless tobacco: Not on file  . Alcohol use Yes     Comment: occ     Allergies   Latex   Review of Systems Review of Systems  Constitutional: Negative for fever.  Gastrointestinal: Negative for abdominal pain, nausea and vomiting.  Genitourinary: Positive for dysuria, hematuria and pelvic pain. Negative for flank pain, vaginal bleeding and vaginal discharge.  All other systems reviewed and are  negative.    Physical Exam Triage Vital Signs ED Triage Vitals [06/15/16 1755]  Enc Vitals Group     BP      Pulse      Resp      Temp      Temp src      SpO2      Weight      Height      Head Circumference      Peak Flow      Pain Score 6     Pain Loc      Pain Edu?      Excl. in [place]?    No data found.   Updated Vital Signs There were no vitals taken for this visit.  Visual Acuity Right Eye Distance:   Left Eye Distance:   Bilateral Distance:    Right Eye Near:   Left Eye Near:    Bilateral Near:     Physical Exam  Constitutional: She is oriented to person, place,  and time. She appears well-developed and well-nourished. No distress.  Neck: Normal range of motion. Neck supple.  Cardiovascular: Normal rate, regular rhythm, normal heart sounds and intact distal pulses.   Pulmonary/Chest: Effort normal and breath sounds normal.  Abdominal: Soft. Bowel sounds are normal. She exhibits no distension. There is no tenderness. There is no guarding.  Neurological: She is alert and oriented to person, place, and time.  Skin: Skin is warm and dry.  Nursing note and vitals reviewed.    UC Treatments / Results  Labs (all labs ordered are listed, but only abnormal results are displayed) Labs Reviewed  URINE CULTURE    EKG  EKG Interpretation None       Radiology No results found.  Procedures Procedures (including critical care time)  Medications Ordered in UC Medications - No data to display   Initial Impression / Assessment and Plan / UC Course  I have reviewed the triage vital signs and the nursing notes.  Pertinent labs & imaging results that were available during my care of the patient were reviewed by me and considered in my medical decision making (see chart for details).  Clinical Course      Final Clinical Impressions(s) / UC Diagnoses   Final diagnoses:  None    New Prescriptions New Prescriptions   No medications on file     Billy Fischer, MD 06/15/16 1820

## 2016-06-15 NOTE — ED Notes (Signed)
Urine specimen obtained while patient in the waiting room

## 2016-06-15 NOTE — ED Triage Notes (Signed)
Pt  Reports  Symptoms    Of  A  uti  To  Include         hematuria    Pain  When  She  Urinates   As  Well      As  Burning  Sensation  She  Reports a  History  Of

## 2016-06-17 ENCOUNTER — Telehealth (HOSPITAL_COMMUNITY): Payer: Self-pay | Admitting: Internal Medicine

## 2016-06-17 LAB — URINE CULTURE: Culture: >=100000 — AB

## 2016-06-17 MED ORDER — SULFAMETHOXAZOLE-TRIMETHOPRIM 800-160 MG PO TABS: 1.0000 | ORAL_TABLET | Freq: Two times a day (BID) | ORAL | 0 refills | Status: AC

## 2016-06-17 NOTE — Telephone Encounter (Signed)
Clinical staff, please let patient know that urine culture is positive for Enterobacter resistant to cephalexin rx given at [facility] visit 06/15/16.  Stop cephalexin.  Will send rx for trimethoprim/sulfa to pharmacy of record, [facility] on [address].  Recheck or followup PCP/David Swayne for further evaluation if symptoms persist.  LM

## 2016-06-23 ENCOUNTER — Telehealth (HOSPITAL_COMMUNITY): Payer: Self-pay | Admitting: Emergency Medicine

## 2016-06-23 NOTE — Telephone Encounter (Signed)
LM on pt's VM [phone]... Also called home [phone] but memory was full Need to see how pt is doing and to give lab results from recent visit on 06/15/16 Also let pt know labs can be obtained from [place]

## 2016-06-23 NOTE — Telephone Encounter (Signed)
-----   Message from Sherlene Shams, MD sent at 06/17/2016  8:29 PM EDT ----- Clinical staff, please let patient know that urine culture is positive for Enterobacter resistant to cephalexin rx given at [facility] visit 06/15/16.  Stop cephalexin.  Will send rx for trimethoprim/sulfa to pharmacy [facility] record, [facility] on [address].  Recheck or followup PCP/David Swayne for further evaluation if symptoms persist.  Note sent to patient's MyChart.  LM

## 2016-06-24 NOTE — Telephone Encounter (Signed)
LM on pt's VM [phone] Courtesy call; no need to call back Need to see how pt is doing and to give lab results from recent visit on 10/1 Also let pt know labs can be obtained from [place]

## 2016-10-18 DIAGNOSIS — G932 Benign intracranial hypertension: Secondary | ICD-10-CM | POA: Insufficient documentation

## 2018-06-06 ENCOUNTER — Other Ambulatory Visit: Payer: Self-pay

## 2018-06-06 ENCOUNTER — Emergency Department (HOSPITAL_COMMUNITY): Payer: BC Managed Care – PPO

## 2018-06-06 ENCOUNTER — Emergency Department (HOSPITAL_COMMUNITY)
Admission: EM | Admit: 2018-06-06 | Discharge: 2018-06-06 | Disposition: A | Discharge status: Home / Self Care | Payer: BC Managed Care – PPO | Attending: Emergency Medicine | Admitting: Emergency Medicine

## 2018-06-06 DIAGNOSIS — Z79899 Other long term (current) drug therapy: Secondary | ICD-10-CM | POA: Insufficient documentation

## 2018-06-06 DIAGNOSIS — E876 Hypokalemia: Secondary | ICD-10-CM

## 2018-06-06 DIAGNOSIS — I10 Essential (primary) hypertension: Secondary | ICD-10-CM | POA: Insufficient documentation

## 2018-06-06 DIAGNOSIS — D1771 Benign lipomatous neoplasm of kidney: Secondary | ICD-10-CM

## 2018-06-06 DIAGNOSIS — R1011 Right upper quadrant pain: Secondary | ICD-10-CM

## 2018-06-06 DIAGNOSIS — R112 Nausea with vomiting, unspecified: Secondary | ICD-10-CM

## 2018-06-06 LAB — LIPASE, BLOOD: Lipase: 26 U/L (ref 11–51)

## 2018-06-06 LAB — CBC
HCT: 43.9 % (ref 36.0–46.0)
HEMOGLOBIN: 14 g/dL (ref 12.0–15.0)
MCH: 28.1 pg (ref 26.0–34.0)
MCHC: 31.9 g/dL (ref 30.0–36.0)
MCV: 88.2 fL (ref 78.0–100.0)
PLATELETS: 339 10*3/uL (ref 150–400)
RBC: 4.98 MIL/uL (ref 3.87–5.11)
RDW: 14.8 % (ref 11.5–15.5)
WBC: 7.5 10*3/uL (ref 4.0–10.5)

## 2018-06-06 LAB — COMPREHENSIVE METABOLIC PANEL
ALT: 16 U/L (ref 0–44)
AST: 16 U/L (ref 15–41)
Albumin: 3.7 g/dL (ref 3.5–5.0)
Alkaline Phosphatase: 79 U/L (ref 38–126)
Anion gap: 13 (ref 5–15)
BUN: 8 mg/dL (ref 6–20)
CO2: 24 mmol/L (ref 22–32)
Calcium: 9.3 mg/dL (ref 8.9–10.3)
Chloride: 104 mmol/L (ref 98–111)
Creatinine, Ser: 0.81 mg/dL (ref 0.44–1.00)
GFR calc Af Amer: >60 mL/min (ref >60)
GFR calc non Af Amer: >60 mL/min (ref >60)
Glucose, Bld: 148 mg/dL — ABNORMAL HIGH (ref 70–99)
Potassium: 3 mmol/L — ABNORMAL LOW (ref 3.5–5.1)
Sodium: 141 mmol/L (ref 135–145)
Total Bilirubin: 0.6 mg/dL (ref 0.3–1.2)
Total Protein: 7.3 g/dL (ref 6.5–8.1)

## 2018-06-06 LAB — URINALYSIS, ROUTINE W REFLEX MICROSCOPIC
Bilirubin Urine: NEGATIVE
Glucose, UA: NEGATIVE mg/dL
Hgb urine dipstick: NEGATIVE
Ketones, ur: NEGATIVE mg/dL
Leukocytes, UA: NEGATIVE
Nitrite: NEGATIVE
Protein, ur: NEGATIVE mg/dL
Specific Gravity, Urine: 1.017 (ref 1.005–1.030)
pH: 7 (ref 5.0–8.0)

## 2018-06-06 MED ORDER — MORPHINE SULFATE (PF) 4 MG/ML IV SOLN
4.0000 mg | Freq: Once | INTRAVENOUS | Status: AC
  Administered 2018-06-06: 4 mg via INTRAVENOUS
  Filled 2018-06-06: qty 1

## 2018-06-06 MED ORDER — IOHEXOL 300 MG/ML  SOLN
100.0000 mL | Freq: Once | INTRAMUSCULAR | Status: AC | PRN
  Administered 2018-06-06: 100 mL via INTRAVENOUS

## 2018-06-06 MED ORDER — ONDANSETRON HCL 4 MG/2ML IJ SOLN
4.0000 mg | Freq: Once | INTRAMUSCULAR | Status: AC
  Administered 2018-06-06: 4 mg via INTRAVENOUS
  Filled 2018-06-06: qty 2

## 2018-06-06 MED ORDER — SUCRALFATE 1 GM/10ML PO SUSP
0.5000 g | Freq: Three times a day (TID) | ORAL | Status: DC
  Administered 2018-06-06: 0.5 g via ORAL
  Filled 2018-06-06: qty 10

## 2018-06-06 MED ORDER — SODIUM CHLORIDE 0.9 % IV BOLUS
500.0000 mL | Freq: Once | INTRAVENOUS | Status: AC
  Administered 2018-06-06: 500 mL via INTRAVENOUS

## 2018-06-06 MED ORDER — PANTOPRAZOLE SODIUM 40 MG PO TBEC
40.0000 mg | DELAYED_RELEASE_TABLET | Freq: Once | ORAL | Status: AC
  Administered 2018-06-06: 40 mg via ORAL
  Filled 2018-06-06: qty 1

## 2018-06-06 MED ORDER — POTASSIUM CHLORIDE 10 MEQ/100ML IV SOLN
10.0000 meq | INTRAVENOUS | Status: AC
  Administered 2018-06-06 (×2): 10 meq via INTRAVENOUS
  Filled 2018-06-06 (×2): qty 100

## 2018-06-06 MED ORDER — SUCRALFATE 1 G PO TABS: 1.0000 g | ORAL_TABLET | Freq: Three times a day (TID) | ORAL | 0 refills | Status: DC

## 2018-06-06 MED ORDER — PROMETHAZINE HCL 25 MG/ML IJ SOLN
25.0000 mg | Freq: Once | INTRAMUSCULAR | Status: AC
  Administered 2018-06-06: 25 mg via INTRAVENOUS
  Filled 2018-06-06: qty 1

## 2018-06-06 MED ORDER — PANTOPRAZOLE SODIUM 20 MG PO TBEC: 20.0000 mg | DELAYED_RELEASE_TABLET | Freq: Every day | ORAL | 0 refills | Status: DC

## 2018-06-06 MED ORDER — POTASSIUM CHLORIDE CRYS ER 20 MEQ PO TBCR: 20.0000 meq | EXTENDED_RELEASE_TABLET | Freq: Every day | ORAL | 0 refills | Status: DC

## 2018-06-06 NOTE — ED Notes (Signed)
ED Provider at bedside. 

## 2018-06-06 NOTE — ED Notes (Signed)
Patient transported to Ultrasound 

## 2018-06-06 NOTE — Discharge Instructions (Addendum)
1. Medications: vicodin and phenergan prescribed yesterday, carafate and protonix, usual home medications 2. Treatment: rest, drink plenty of fluids, advance diet slowly 3. Follow Up: Please followup with your primary doctor in 2 days and GI this week for discussion of your diagnoses and further evaluation after today's visit; if you do not have a primary care doctor use the resource guide provided to find one; Please return to the ER for persistent vomiting, worsening pain, high fevers or worsening symptoms

## 2018-06-06 NOTE — ED Triage Notes (Signed)
Patient was seen by provider late Friday afternoon for RUQ pain, that has worsened. Her provider gave RX hydrocodone and phenergan and the plan was to get an [place] referral on Monday, but the patient states she cannot get comfortable.

## 2018-06-06 NOTE — ED Provider Notes (Signed)
[facility] DEPARTMENT Provider Note   CSN: [id number] Arrival date & time: 06/06/18  0019     History   Chief Complaint Chief Complaint  Patient presents with  . Abdominal Pain    RUQ    HPI Cynthia Tapia is a 52 y.o. female with a hx of anxiety, hyperlipidemia, HTN, migraine presents to the Emergency Department complaining of gradual, persistent, progressively worsening RUQ abd pain onset 3 days ago.  Reports associated nausea and vomiting.  She reports that lying flat makes the pain worse.  Moving slowly makes it better.  Pt reports she was evaluated by her primary provider yesterday afternoon who prescribed oxycodone and Phenergan.  Patient was to have ultrasound on Monday.  Patient reports that pain has worsened acutely tonight she is unable to get comfortable thus prompting her visit here to the emergency department.  Patient reports previous history of hysterectomy and cesarean section but no other abdominal surgeries.  No known sick contacts or international travel.  Nothing seems to make the patient better.  She denies fever, chills, headache, neck pain, chest pain, shortness of breath, diarrhea, weakness, dizziness, syncope.   The history is provided by the patient, medical records and the spouse. No language interpreter was used.    Past Medical History:  Diagnosis Date  . Anxiety   . Hyperlipidemia   . Hypertension   . Migraine     There are no active problems to display for this patient.   Past Surgical History:  Procedure Laterality Date  . ABDOMINAL HYSTERECTOMY     partial  . CESAREAN SECTION       OB History   None      Home Medications    Prior to Admission medications   Medication Sig Start Date End Date Taking? Authorizing Provider  acetaZOLAMIDE (DIAMOX) 250 MG tablet Take 250 mg by mouth 2 (two) times daily. 04/21/18  Yes [provider]  clonazePAM (KLONOPIN) 1 MG tablet Take 1 mg by mouth 2 (two) times  daily as needed for anxiety.  12/15/14  Yes [provider]  HYDROcodone-acetaminophen (NORCO/VICODIN) 5-325 MG tablet Take 1 tablet by mouth 3 (three) times daily as needed for moderate pain.  06/04/18  Yes [provider]  losartan-hydrochlorothiazide (HYZAAR) 100-25 MG tablet Take 1 tablet by mouth daily. 06/05/18  Yes [provider]  promethazine (PHENERGAN) 25 MG tablet Take 25 mg by mouth every 4 (four) hours as needed for nausea or vomiting.  06/04/18  Yes [provider]  SUMAtriptan (IMITREX) 100 MG tablet Take 100 mg by mouth every 2 (two) hours as needed.  04/06/15 06/06/26 Yes [provider]  cephALEXin (KEFLEX) 500 MG capsule Take 1 capsule (500 mg total) by mouth 4 (four) times daily. Take all of medicine and drink lots of fluids Patient not taking: Reported on 06/06/2018 06/15/16   Billy Fischer, MD  pantoprazole (PROTONIX) 20 MG tablet Take 1 tablet (20 mg total) by mouth daily. 06/06/18   Keitha Kolk, Jarrett Soho, PA-C  potassium chloride SA (K-DUR,KLOR-CON) 20 MEQ tablet Take 1 tablet (20 mEq total) by mouth daily. 06/06/18   Devann Cribb, Jarrett Soho, PA-C  sucralfate (CARAFATE) 1 g tablet Take 1 tablet (1 g total) by mouth 4 (four) times daily -  with meals and at bedtime. 06/06/18   Berton Butrick, Jarrett Soho, PA-C    Family History Family History  Problem Relation Age of Onset  . Migraines Mother     Social History Social History  Tobacco Use  . Smoking status: Never Smoker  Substance Use Topics  . Alcohol use: Yes    Comment: occ  . Drug use: No     Allergies   Latex   Review of Systems Review of Systems  Constitutional: Negative for appetite change, diaphoresis, fatigue, fever and unexpected weight change.  HENT: Negative for mouth sores.   Eyes: Negative for visual disturbance.  Respiratory: Negative for cough, chest tightness, shortness of breath and wheezing.   Cardiovascular: Negative for chest pain.  Gastrointestinal:  Positive for abdominal pain, nausea and vomiting. Negative for constipation and diarrhea.  Endocrine: Negative for polydipsia, polyphagia and polyuria.  Genitourinary: Negative for dysuria, frequency, hematuria and urgency.  Musculoskeletal: Negative for back pain and neck stiffness.  Skin: Negative for rash.  Allergic/Immunologic: Negative for immunocompromised state.  Neurological: Negative for syncope, light-headedness and headaches.  Hematological: Does not bruise/bleed easily.  Psychiatric/Behavioral: Negative for sleep disturbance. The patient is not nervous/anxious.      Physical Exam Updated Vital Signs BP 105/69   Pulse 66   Temp 97.8 F (36.6 C) (Oral)   Resp 12   Wt 98.4 kg   SpO2 98%   Physical Exam  Constitutional: She appears well-developed and well-nourished. No distress.  Awake, alert, nontoxic appearance  HENT:  Head: Normocephalic and atraumatic.  Mouth/Throat: Oropharynx is clear and moist. No oropharyngeal exudate.  Eyes: Conjunctivae are normal. No scleral icterus.  Neck: Normal range of motion. Neck supple.  Cardiovascular: Normal rate, regular rhythm and intact distal pulses.  Pulmonary/Chest: Effort normal and breath sounds normal. No respiratory distress. She has no wheezes.  Equal chest expansion  Abdominal: Soft. Bowel sounds are normal. She exhibits no mass. There is tenderness in the right upper quadrant and epigastric area. There is no rigidity, no rebound, no guarding and no CVA tenderness.  Musculoskeletal: Normal range of motion. She exhibits no edema.  Neurological: She is alert.  Speech is clear and goal oriented Moves extremities without ataxia  Skin: Skin is warm and dry. She is not diaphoretic.  Psychiatric: She has a normal mood and affect.  Nursing note and vitals reviewed.    ED Treatments / Results  Labs (all labs ordered are listed, but only abnormal results are displayed) Labs Reviewed  COMPREHENSIVE METABOLIC PANEL -  Abnormal; Notable for the following components:      Result Value   Potassium 3.0 (*)    Glucose, Bld 148 (*)    All other components within normal limits  URINALYSIS, ROUTINE W REFLEX MICROSCOPIC - Abnormal; Notable for the following components:   APPearance CLOUDY (*)    All other components within normal limits  LIPASE, BLOOD  CBC     Radiology Ct Abdomen Pelvis W Contrast  Result Date: 06/06/2018 CLINICAL DATA:  52 y/o F; worsening right upper quadrant abdominal pain. EXAM: CT ABDOMEN AND PELVIS WITH CONTRAST TECHNIQUE: Multidetector CT imaging of the abdomen and pelvis was performed using the standard protocol following bolus administration of intravenous contrast. CONTRAST:  126mL OMNIPAQUE IOHEXOL 300 MG/ML  SOLN COMPARISON:  06/06/2018 right upper quadrant ultrasound. FINDINGS: Lower chest: No acute abnormality. Hepatobiliary: No focal liver abnormality is seen. No gallstones, gallbladder wall thickening, or biliary dilatation. Pancreas: Unremarkable. No pancreatic ductal dilatation or surrounding inflammatory changes. Spleen: Normal in size without focal abnormality. Adrenals/Urinary Tract: Normal adrenal glands. 14 mm left kidney lower pole angiomyolipoma. Probable 7 mm angiomyolipoma of the right kidney upper pole (series 6, image 144). No urinary stone disease.  No hydronephrosis. Normal bladder. Stomach/Bowel: Small hiatal hernia. Appendix appears normal. No evidence of bowel wall thickening, distention, or inflammatory changes. Vascular/Lymphatic: Aortic atherosclerosis. No enlarged abdominal or pelvic lymph nodes. Reproductive: Status post hysterectomy. Simple 2.1 cm right adnexal cyst. Other: No abdominal wall hernia or abnormality. No abdominopelvic ascites. Musculoskeletal: No fracture is seen. Mild multilevel discogenic degenerative changes of visible thoracic and lumbar spine. IMPRESSION: 1. No acute process identified. 2. Left kidney 14 mm lower pole angiomyolipoma and probable 7  mm right kidney upper pole angiomyolipoma. 3.  Aortic Atherosclerosis (ICD10-I70.0). 4. Mild degenerative changes of the spine. Electronically Signed   By: Kristine Garbe M.D.   On: 06/06/2018 05:38   US Abdomen Limited  Result Date: 06/06/2018 CLINICAL DATA:  52 year old female with right upper quadrant abdominal pain. EXAM: ULTRASOUND ABDOMEN LIMITED RIGHT UPPER QUADRANT COMPARISON:  None. FINDINGS: Gallbladder: No gallstones or wall thickening visualized. No sonographic Murphy sign noted by sonographer. Common bile duct: Diameter: 3 mm Liver: There is diffuse increased liver echogenicity most consistent with fatty infiltration. Superimposed fibrosis or inflammation is not excluded. Portal vein is patent on color Doppler imaging with normal direction of blood flow towards the liver. IMPRESSION: Fatty liver otherwise unremarkable right upper quadrant ultrasound. Electronically Signed   By: Anner Crete M.D.   On: 06/06/2018 04:06    Procedures Procedures (including critical care time)  Medications Ordered in ED Medications  sucralfate (CARAFATE) 1 GM/10ML suspension 0.5 g (has no administration in time range)  morphine 4 MG/ML injection 4 mg (4 mg Intravenous Given 06/06/18 0234)  ondansetron (ZOFRAN) injection 4 mg (4 mg Intravenous Given 06/06/18 0234)  sodium chloride 0.9 % bolus 500 mL (0 mLs Intravenous Stopped 06/06/18 0242)  potassium chloride 10 mEq in 100 mL IVPB (0 mEq Intravenous Stopped 06/06/18 0444)  morphine 4 MG/ML injection 4 mg (4 mg Intravenous Given 06/06/18 0445)  promethazine (PHENERGAN) injection 25 mg (25 mg Intravenous Given 06/06/18 0438)  iohexol (OMNIPAQUE) 300 MG/ML solution 100 mL (100 mLs Intravenous Contrast Given 06/06/18 0500)  pantoprazole (PROTONIX) EC tablet 40 mg (40 mg Oral Given 06/06/18 0626)     Initial Impression / Assessment and Plan / ED Course  I have reviewed the triage vital signs and the nursing notes.  Pertinent labs & imaging  results that were available during my care of the patient were reviewed by me and considered in my medical decision making (see chart for details).  Clinical Course as of Jun 06 649  Sun Jun 06, 2018  0505 Pt actively vomiting.  Phenergan and additional morphine given   [HM]  0632 Pt is feeling some better.  No additional vomiting.   [HM]    Clinical Course User Index [HM] Nishanth Mccaughan, Gwenlyn Perking    Presents with right upper quadrant abdominal pain.  On exam right upper quadrant abdominal tenderness with positive Murphy sign.  Suspect cholecystitis.  Labs are reassuring without elevation of AST, ALT or lipase.  Ultrasound without evidence of cholecystitis, cholelithiasis, choledocholithiasis or cholangitis.  Patient continues to have severe pain in his vomiting.  CT scan obtained without acute abnormalities.  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  No evidence of diverticulitis, colitis.  No free air.  No evidence of perforated ulcer.  Patient's vomiting has stopped and her pain is better controlled.  She was given Protonix and Carafate.  She will be discharged home with the same.  Patient has prescriptions for Vicodin and Phenergan at home.  She  is to follow-up with her primary care provider and [facility] gastroenterology.  She is to return immediately here to the emergency department for new or worsening symptoms including fever, worsening pain, persistent vomiting or other concerns.  The patient was discussed with and seen by Dr. Betsey Holiday who agrees with the plan for d/c home and GI follow-up.     Final Clinical Impressions(s) / ED Diagnoses   Final diagnoses:  RUQ abdominal pain  Non-intractable vomiting with nausea, unspecified vomiting type  Angiomyolipoma of both kidneys  Hypokalemia    ED Discharge Orders         Ordered    potassium chloride SA (K-DUR,KLOR-CON) 20 MEQ tablet  Daily     06/06/18 0634    pantoprazole (PROTONIX) 20 MG tablet  Daily      06/06/18 0634    sucralfate (CARAFATE) 1 g tablet  3 times daily with meals & bedtime     06/06/18 0634           Willodean Leven, Jarrett Soho, PA-C 06/06/18 7737    Orpah Greek, MD 06/06/18 [id number]

## 2018-06-06 NOTE — ED Notes (Signed)
Patient transported to CT 

## 2018-06-07 ENCOUNTER — Other Ambulatory Visit (HOSPITAL_COMMUNITY): Payer: Self-pay | Admitting: Physician Assistant

## 2018-06-07 DIAGNOSIS — R1011 Right upper quadrant pain: Secondary | ICD-10-CM

## 2018-06-09 ENCOUNTER — Other Ambulatory Visit: Payer: Self-pay | Admitting: Family Medicine

## 2018-06-09 DIAGNOSIS — R1011 Right upper quadrant pain: Secondary | ICD-10-CM

## 2018-06-14 ENCOUNTER — Ambulatory Visit (HOSPITAL_COMMUNITY)
Admission: RE | Admit: 2018-06-14 | Discharge: 2018-06-14 | Disposition: A | Discharge status: Home / Self Care | Payer: BC Managed Care – PPO | Source: Ambulatory Visit | Attending: Physician Assistant | Admitting: Physician Assistant

## 2018-06-14 DIAGNOSIS — R1011 Right upper quadrant pain: Secondary | ICD-10-CM | POA: Insufficient documentation

## 2018-06-14 MED ORDER — TECHNETIUM TC 99M MEBROFENIN IV KIT
5.2000 | PACK | Freq: Once | INTRAVENOUS | Status: AC | PRN
  Administered 2018-06-14: 5.2 via INTRAVENOUS

## 2018-06-18 ENCOUNTER — Ambulatory Visit: Payer: Self-pay | Admitting: Surgery

## 2018-06-18 NOTE — H&P (Signed)
Cynthia Tapia Documented: 06/18/2018 10:14 AM Location: [facility] Patient #: [id number] DOB: [birth year] Married / Language: Cleophus Molt / Race: White Female  History of Present Illness Marcello Moores A. Shinichi Anguiano MD; 06/18/2018 11:40 AM) Patient words: Patient sent at the request of Dr. Si Gaul for a two-week history of severe right upper quadrant pain. Pain is relatively constant. Locations right upper quadrant of her abdomen. She is undergone extensive GI workup including ultrasound which was normal. She underwent a HIDA study which showed an ejection fraction of 13% and reproduction of her right upper quadrant pain with Ensure. She's having severe pain with nausea and vomiting. She is able eat small meals and is able to keep herself hydrated enough to stay out of the hospital. She's on been given tramadol for pain. She's been given L's to manage her pain or nausea. She lost 14 pounds in the last 2 weeks.  The patient is a 52 year old female.   Past Surgical History Sharyn Lull R. Brooks, CMA; 06/18/2018 10:15 AM) Cesarean Section - Multiple Hysterectomy (not due to cancer) - Partial  Diagnostic Studies History Sharyn Lull R. Brooks, CMA; 06/18/2018 10:15 AM) Colonoscopy never Mammogram 1-3 years ago Pap Smear >5 years ago  Allergies Sharyn Lull R. Brooks, CMA; 06/18/2018 10:15 AM) No Known Drug Allergies [06/18/2018]:  Medication History Sharyn Lull R. Brooks, CMA; 06/18/2018 10:16 AM) Losartan Potassium-HCTZ (100-25MG  Tablet, Oral) Active. amLODIPine Besylate (5MG  Tablet, Oral) Active. acetaZOLAMIDE (500MG  Capsule ER, Oral) Active. DULoxetine HCl (30MG  Capsule DR Part, Oral) Active. SUMAtriptan Succinate (100MG  Tablet, Oral) Active. tiZANidine HCl (4MG  Tablet, Oral) Active. clonazePAM (1MG  Tablet, Oral) Active. Promethazine HCl (25MG  Tablet, Oral) Active. Pravastatin Sodium (20MG  Tablet, Oral) Active. Medications Reconciled  Social History Sharyn Lull R. Brooks, CMA;  06/18/2018 10:15 AM) Alcohol use Occasional alcohol use. No caffeine use No drug use Tobacco use Never smoker.  Family History Sharyn Lull R. Rolena Infante, CMA; 06/18/2018 10:15 AM) Alcohol Abuse Brother, Mother. Diabetes Mellitus Father. Heart Disease Father, Mother. Hypertension Brother, Father, Mother. Kidney Disease Father. Melanoma Mother. Migraine Headache Mother. Respiratory Condition Mother.  Pregnancy / Birth History Sharyn Lull R. Brooks, CMA; 06/18/2018 10:15 AM) Age at menarche 41 years. Age of menopause <47 Contraceptive History Oral contraceptives. Gravida 2 Irregular periods Length (months) of breastfeeding 7-12 Maternal age 61-25 Para 2  Other Problems Sharyn Lull R. Brooks, CMA; 06/18/2018 10:15 AM) Gastroesophageal Reflux Disease High blood pressure Hypercholesterolemia Migraine Headache     Review of Systems [employer] R. Brooks CMA; 06/18/2018 10:15 AM) General Present- Appetite Loss, Chills and Weight Loss. Not Present- Fatigue, Fever, Night Sweats and Weight Gain. Skin Not Present- Change in Wart/Mole, Dryness, Hives, Jaundice, New Lesions, Non-Healing Wounds, Rash and Ulcer. HEENT Present- Wears glasses/contact lenses. Not Present- Earache, Hearing Loss, Hoarseness, Nose Bleed, Oral Ulcers, Ringing in the Ears, Seasonal Allergies, Sinus Pain, Sore Throat, Visual Disturbances and Yellow Eyes. Respiratory Present- Snoring. Not Present- Bloody sputum, Chronic Cough, Difficulty Breathing and Wheezing. Breast Not Present- Breast Mass, Breast Pain, Nipple Discharge and Skin Changes. Cardiovascular Not Present- Chest Pain, Difficulty Breathing Lying Down, Leg Cramps, Palpitations, Rapid Heart Rate, Shortness of Breath and Swelling of Extremities. Gastrointestinal Present- Abdominal Pain, Indigestion, Nausea and Vomiting. Not Present- Bloating, Bloody Stool, Change in Bowel Habits, Chronic diarrhea, Constipation, Difficulty Swallowing, Excessive gas,  Gets full quickly at meals, Hemorrhoids and Rectal Pain. Female Genitourinary Not Present- Frequency, Nocturia, Painful Urination, Pelvic Pain and Urgency. Musculoskeletal Not Present- Back Pain, Joint Pain, Joint Stiffness, Muscle Pain, Muscle Weakness and Swelling of Extremities. Neurological Present- Headaches. Not  Present- Decreased Memory, Fainting, Numbness, Seizures, Tingling, Tremor, Trouble walking and Weakness. Psychiatric Not Present- Anxiety, Bipolar, Change in Sleep Pattern, Depression, Fearful and Frequent crying. Endocrine Present- Hot flashes. Not Present- Cold Intolerance, Excessive Hunger, Hair Changes, Heat Intolerance and New Diabetes. Hematology Not Present- Blood Thinners, Easy Bruising, Excessive bleeding, Gland problems, HIV and Persistent Infections.  Vitals [employer] R. Brooks CMA; 06/18/2018 10:15 AM) 06/18/2018 10:14 AM Weight: 211.25 lb Height: 68in Body Surface Area: 2.09 m Body Mass Index: 32.12 kg/m  Pulse: 80 (Regular)  BP: 128/86 (Sitting, Left Arm, Standard)      Physical Exam (Francile Woolford A. Mahamud Metts MD; 06/18/2018 11:40 AM)  General Mental Status-Alert. General Appearance-Consistent with stated age. Hydration-Well hydrated. Voice-Normal.  Head and Neck Head-normocephalic, atraumatic with no lesions or palpable masses. Trachea-midline. Thyroid Gland Characteristics - normal size and consistency.  Eye Eyeball - Bilateral-Extraocular movements intact. Sclera/Conjunctiva - Bilateral-No scleral icterus.  Cardiovascular Cardiovascular examination reveals -normal heart sounds, regular rate and rhythm with no murmurs and normal pedal pulses bilaterally.  Abdomen Inspection Inspection of the abdomen reveals - No Hernias. Skin - Scar - no surgical scars. Palpation/Percussion Palpation and Percussion of the abdomen reveal - Soft, Non Tender, No Rebound tenderness, No Rigidity (guarding) and No  hepatosplenomegaly. Auscultation Auscultation of the abdomen reveals - Bowel sounds normal.  Neurologic Neurologic evaluation reveals -alert and oriented x 3 with no impairment of recent or remote memory. Mental Status-Normal.  Musculoskeletal Normal Exam - Left-Upper Extremity Strength Normal and Lower Extremity Strength Normal. Normal Exam - Right-Upper Extremity Strength Normal and Lower Extremity Strength Normal.    Assessment & Plan (Verona Hartshorn A. Arius Harnois MD; 06/18/2018 11:41 AM)  BILIARY DYSKINESIA (K82.8) Impression: Having severe symptoms that are fairly constant. She has a negative Murphy sign making acute cholecystitis. Recommend that her pain management and antibiotics. We'll set up for laparoscopic cholecystectomy next week and if encouraged small frequent meals which are low-fat him plenty of liquids as tolerated. If she becomes unable to eat at all she will need to be admitted to the hospital. The procedure has been discussed with the patient. Risks of laparoscopic cholecystectomy include bleeding, infection, bile duct injury, leak, death, open surgery, diarrhea, other surgery, organ injury, blood vessel injury, DVT, and additional care.  Current Plans You are being scheduled for surgery- Our schedulers will call you.  You should hear from our office's scheduling department within 5 working days about the location, date, and time of surgery. We try to make accommodations for patient's preferences in scheduling surgery, but sometimes the OR schedule or the surgeon's schedule prevents [place] from making those accommodations.  If you have not heard from our office [phone]) in 5 working days, call the office and ask for your surgeon's nurse.  If you have other questions about your diagnosis, plan, or surgery, call the office and ask for your surgeon's nurse.  Pt Education - Pamphlet Given - Laparoscopic Gallbladder Surgery: discussed with patient and provided  information. Written instructions provided The anatomy & physiology of hepatobiliary & pancreatic function was discussed. The pathophysiology of gallbladder dysfunction was discussed. Natural history risks without surgery was discussed. I feel the risks of no intervention will lead to serious problems that outweigh the operative risks; therefore, I recommended cholecystectomy to remove the pathology. I explained laparoscopic techniques with possible need for an open approach. Probable cholangiogram to evaluate the bilary tract was explained as well.  Risks such as bleeding, infection, abscess, leak, injury to other organs, need for further treatment, heart  attack, death, and other risks were discussed. I noted a good likelihood this will help address the problem. Possibility that this will not correct all abdominal symptoms was explained. Goals of post-operative recovery were discussed as well. We will work to minimize complications. An educational handout further explaining the pathology and treatment options was given as well. Questions were answered. The patient expresses understanding & wishes to proceed with surgery.  Pt Education - [facility] Good Bowel Health (Gross) Pt Education - [facility] Laparosopic Post Op HCI (Gross) Started Percocet 5-325 MG Oral Tablet, 1 (one) Tablet four times daily, as needed, #20, 06/18/2018, No Refill. Started Zofran 4 MG Oral Tablet, 1 (one) Tablet every six hours, as needed, #20, 06/18/2018, No Refill.

## 2018-06-18 NOTE — H&P (View-Only) (Signed)
NORETA KUE Documented: 06/18/2018 10:14 AM Location: [facility] Patient #: [id number] DOB: Aug 27, 1966 Married / Language: Cleophus Molt / Race: White Female  History of Present Illness Marcello Moores A. Derrisha Foos MD; 06/18/2018 11:40 AM) Patient words: Patient sent at the request of Dr. Si Gaul for a two-week history of severe right upper quadrant pain. Pain is relatively constant. Locations right upper quadrant of her abdomen. She is undergone extensive GI workup including ultrasound which was normal. She underwent a HIDA study which showed an ejection fraction of 13% and reproduction of her right upper quadrant pain with Ensure. She's having severe pain with nausea and vomiting. She is able eat small meals and is able to keep herself hydrated enough to stay out of the hospital. She's on been given tramadol for pain. She's been given L's to manage her pain or nausea. She lost 14 pounds in the last 2 weeks.  The patient is a 52 year old female.   Past Surgical History Sharyn Lull R. Brooks, CMA; 06/18/2018 10:15 AM) Cesarean Section - Multiple Hysterectomy (not due to cancer) - Partial  Diagnostic Studies History Sharyn Lull R. Brooks, CMA; 06/18/2018 10:15 AM) Colonoscopy never Mammogram 1-3 years ago Pap Smear >5 years ago  Allergies Sharyn Lull R. Brooks, CMA; 06/18/2018 10:15 AM) No Known Drug Allergies [06/18/2018]:  Medication History Sharyn Lull R. Brooks, CMA; 06/18/2018 10:16 AM) Losartan Potassium-HCTZ (100-25MG  Tablet, Oral) Active. amLODIPine Besylate (5MG  Tablet, Oral) Active. acetaZOLAMIDE (500MG  Capsule ER, Oral) Active. DULoxetine HCl (30MG  Capsule DR Part, Oral) Active. SUMAtriptan Succinate (100MG  Tablet, Oral) Active. tiZANidine HCl (4MG  Tablet, Oral) Active. clonazePAM (1MG  Tablet, Oral) Active. Promethazine HCl (25MG  Tablet, Oral) Active. Pravastatin Sodium (20MG  Tablet, Oral) Active. Medications Reconciled  Social History Sharyn Lull R. Brooks, CMA;  06/18/2018 10:15 AM) Alcohol use Occasional alcohol use. No caffeine use No drug use Tobacco use Never smoker.  Family History Sharyn Lull R. Rolena Infante, CMA; 06/18/2018 10:15 AM) Alcohol Abuse Brother, Mother. Diabetes Mellitus Father. Heart Disease Father, Mother. Hypertension Brother, Father, Mother. Kidney Disease Father. Melanoma Mother. Migraine Headache Mother. Respiratory Condition Mother.  Pregnancy / Birth History Sharyn Lull R. Brooks, CMA; 06/18/2018 10:15 AM) Age at menarche 29 years. Age of menopause <47 Contraceptive History Oral contraceptives. Gravida 2 Irregular periods Length (months) of breastfeeding 7-12 Maternal age 86-25 Para 2  Other Problems Sharyn Lull R. Brooks, CMA; 06/18/2018 10:15 AM) Gastroesophageal Reflux Disease High blood pressure Hypercholesterolemia Migraine Headache     Review of Systems [employer] [facility] R. Brooks CMA; 06/18/2018 10:15 AM) General Present- Appetite Loss, Chills and Weight Loss. Not Present- Fatigue, Fever, Night Sweats and Weight Gain. Skin Not Present- Change in Wart/Mole, Dryness, Hives, Jaundice, New Lesions, Non-Healing Wounds, Rash and Ulcer. HEENT Present- Wears glasses/contact lenses. Not Present- Earache, Hearing Loss, Hoarseness, Nose Bleed, Oral Ulcers, Ringing in the Ears, Seasonal Allergies, Sinus Pain, Sore Throat, Visual Disturbances and Yellow Eyes. Respiratory Present- Snoring. Not Present- Bloody sputum, Chronic Cough, Difficulty Breathing and Wheezing. Breast Not Present- Breast Mass, Breast Pain, Nipple Discharge and Skin Changes. Cardiovascular Not Present- Chest Pain, Difficulty Breathing Lying Down, Leg Cramps, Palpitations, Rapid Heart Rate, Shortness of Breath and Swelling of Extremities. Gastrointestinal Present- Abdominal Pain, Indigestion, Nausea and Vomiting. Not Present- Bloating, Bloody Stool, Change in Bowel Habits, Chronic diarrhea, Constipation, Difficulty Swallowing, Excessive gas,  Gets full quickly at meals, Hemorrhoids and Rectal Pain. Female Genitourinary Not Present- Frequency, Nocturia, Painful Urination, Pelvic Pain and Urgency. Musculoskeletal Not Present- Back Pain, Joint Pain, Joint Stiffness, Muscle Pain, Muscle Weakness and Swelling of Extremities. Neurological Present- Headaches. Not  Present- Decreased Memory, Fainting, Numbness, Seizures, Tingling, Tremor, Trouble walking and Weakness. Psychiatric Not Present- Anxiety, Bipolar, Change in Sleep Pattern, Depression, Fearful and Frequent crying. Endocrine Present- Hot flashes. Not Present- Cold Intolerance, Excessive Hunger, Hair Changes, Heat Intolerance and New Diabetes. Hematology Not Present- Blood Thinners, Easy Bruising, Excessive bleeding, Gland problems, HIV and Persistent Infections.  Vitals [employer] R. Brooks CMA; 06/18/2018 10:15 AM) 06/18/2018 10:14 AM Weight: 211.25 lb Height: 68in Body Surface Area: 2.09 m Body Mass Index: 32.12 kg/m  Pulse: 80 (Regular)  BP: 128/86 (Sitting, Left Arm, Standard)      Physical Exam (Rico Massar A. Marleni Gallardo MD; 06/18/2018 11:40 AM)  General Mental Status-Alert. General Appearance-Consistent with stated age. Hydration-Well hydrated. Voice-Normal.  Head and Neck Head-normocephalic, atraumatic with no lesions or palpable masses. Trachea-midline. Thyroid Gland Characteristics - normal size and consistency.  Eye Eyeball - Bilateral-Extraocular movements intact. Sclera/Conjunctiva - Bilateral-No scleral icterus.  Cardiovascular Cardiovascular examination reveals -normal heart sounds, regular rate and rhythm with no murmurs and normal pedal pulses bilaterally.  Abdomen Inspection Inspection of the abdomen reveals - No Hernias. Skin - Scar - no surgical scars. Palpation/Percussion Palpation and Percussion of the abdomen reveal - Soft, Non Tender, No Rebound tenderness, No Rigidity (guarding) and No  hepatosplenomegaly. Auscultation Auscultation of the abdomen reveals - Bowel sounds normal.  Neurologic Neurologic evaluation reveals -alert and oriented x 3 with no impairment of recent or remote memory. Mental Status-Normal.  Musculoskeletal Normal Exam - Left-Upper Extremity Strength Normal and Lower Extremity Strength Normal. Normal Exam - Right-Upper Extremity Strength Normal and Lower Extremity Strength Normal.    Assessment & Plan (Damel Querry A. Eathon Valade MD; 06/18/2018 11:41 AM)  BILIARY DYSKINESIA (K82.8) Impression: Having severe symptoms that are fairly constant. She has a negative Murphy sign making acute cholecystitis. Recommend that her pain management and antibiotics. We'll set up for laparoscopic cholecystectomy next week and if encouraged small frequent meals which are low-fat him plenty of liquids as tolerated. If she becomes unable to eat at all she will need to be admitted to the hospital. The procedure has been discussed with the patient. Risks of laparoscopic cholecystectomy include bleeding, infection, bile duct injury, leak, death, open surgery, diarrhea, other surgery, organ injury, blood vessel injury, DVT, and additional care.  Current Plans You are being scheduled for surgery- Our schedulers will call you.  You should hear from our office's scheduling department within 5 working days about the location, date, and time of surgery. We try to make accommodations for patient's preferences in scheduling surgery, but sometimes the OR schedule or the surgeon's schedule prevents [place] from making those accommodations.  If you have not heard from our office [phone]) in 5 working days, call the office and ask for your surgeon's nurse.  If you have other questions about your diagnosis, plan, or surgery, call the office and ask for your surgeon's nurse.  Pt Education - Pamphlet Given - Laparoscopic Gallbladder Surgery: discussed with patient and provided  information. Written instructions provided The anatomy & physiology of hepatobiliary & pancreatic function was discussed. The pathophysiology of gallbladder dysfunction was discussed. Natural history risks without surgery was discussed. I feel the risks of no intervention will lead to serious problems that outweigh the operative risks; therefore, I recommended cholecystectomy to remove the pathology. I explained laparoscopic techniques with possible need for an open approach. Probable cholangiogram to evaluate the bilary tract was explained as well.  Risks such as bleeding, infection, abscess, leak, injury to other organs, need for further treatment, heart  attack, death, and other risks were discussed. I noted a good likelihood this will help address the problem. Possibility that this will not correct all abdominal symptoms was explained. Goals of post-operative recovery were discussed as well. We will work to minimize complications. An educational handout further explaining the pathology and treatment options was given as well. Questions were answered. The patient expresses understanding & wishes to proceed with surgery.  Pt Education - [facility] Good Bowel Health (Gross) Pt Education - [facility] Laparosopic Post Op HCI (Gross) Started Percocet 5-325 MG Oral Tablet, 1 (one) Tablet four times daily, as needed, #20, 06/18/2018, No Refill. Started Zofran 4 MG Oral Tablet, 1 (one) Tablet every six hours, as needed, #20, 06/18/2018, No Refill.

## 2018-06-22 ENCOUNTER — Other Ambulatory Visit: Payer: Self-pay

## 2018-06-22 ENCOUNTER — Encounter (HOSPITAL_COMMUNITY): Payer: Self-pay | Admitting: *Deleted

## 2018-06-22 MED ORDER — CEFAZOLIN SODIUM-DEXTROSE 2-4 GM/100ML-% IV SOLN
2.0000 g | INTRAVENOUS | Status: AC
  Administered 2018-06-23: 2 g via INTRAVENOUS
  Filled 2018-06-22: qty 100

## 2018-06-23 ENCOUNTER — Ambulatory Visit (HOSPITAL_COMMUNITY): Payer: BC Managed Care – PPO | Admitting: Certified Registered"

## 2018-06-23 ENCOUNTER — Ambulatory Visit (HOSPITAL_COMMUNITY)
Admission: RE | Admit: 2018-06-23 | Discharge: 2018-06-23 | Disposition: A | Discharge status: Home / Self Care | Payer: BC Managed Care – PPO | Source: Ambulatory Visit | Attending: Surgery | Admitting: Surgery

## 2018-06-23 ENCOUNTER — Encounter (HOSPITAL_COMMUNITY)
Admission: RE | Disposition: A | Discharge status: Home / Self Care | Payer: Self-pay | Source: Ambulatory Visit | Attending: Surgery

## 2018-06-23 ENCOUNTER — Encounter (HOSPITAL_COMMUNITY): Payer: Self-pay | Admitting: Urology

## 2018-06-23 DIAGNOSIS — K219 Gastro-esophageal reflux disease without esophagitis: Secondary | ICD-10-CM | POA: Diagnosis not present

## 2018-06-23 DIAGNOSIS — E7800 Pure hypercholesterolemia, unspecified: Secondary | ICD-10-CM | POA: Diagnosis not present

## 2018-06-23 DIAGNOSIS — K811 Chronic cholecystitis: Secondary | ICD-10-CM | POA: Diagnosis not present

## 2018-06-23 DIAGNOSIS — F419 Anxiety disorder, unspecified: Secondary | ICD-10-CM | POA: Diagnosis not present

## 2018-06-23 DIAGNOSIS — Z79899 Other long term (current) drug therapy: Secondary | ICD-10-CM | POA: Insufficient documentation

## 2018-06-23 DIAGNOSIS — I10 Essential (primary) hypertension: Secondary | ICD-10-CM | POA: Insufficient documentation

## 2018-06-23 DIAGNOSIS — R1011 Right upper quadrant pain: Secondary | ICD-10-CM | POA: Diagnosis present

## 2018-06-23 DIAGNOSIS — G43909 Migraine, unspecified, not intractable, without status migrainosus: Secondary | ICD-10-CM | POA: Insufficient documentation

## 2018-06-23 HISTORY — DX: Personal history of other diseases of the digestive system: Z87.19

## 2018-06-23 HISTORY — DX: Gastro-esophageal reflux disease without esophagitis: K21.9

## 2018-06-23 HISTORY — PX: CHOLECYSTECTOMY: SHX55

## 2018-06-23 LAB — COMPREHENSIVE METABOLIC PANEL
ALK PHOS: 72 U/L (ref 38–126)
ALT: 15 U/L (ref 0–44)
ANION GAP: 9 (ref 5–15)
AST: 20 U/L (ref 15–41)
Albumin: 3.7 g/dL (ref 3.5–5.0)
BUN: 6 mg/dL (ref 6–20)
CALCIUM: 8.7 mg/dL — AB (ref 8.9–10.3)
CO2: 24 mmol/L (ref 22–32)
Chloride: 106 mmol/L (ref 98–111)
Creatinine, Ser: 1.05 mg/dL — ABNORMAL HIGH (ref 0.44–1.00)
GFR calc non Af Amer: >60 mL/min (ref >60)
Glucose, Bld: 114 mg/dL — ABNORMAL HIGH (ref 70–99)
Potassium: 2.3 mmol/L — CL (ref 3.5–5.1)
Sodium: 139 mmol/L (ref 135–145)
TOTAL PROTEIN: 7.1 g/dL (ref 6.5–8.1)
Total Bilirubin: 0.5 mg/dL (ref 0.3–1.2)

## 2018-06-23 LAB — CBC WITH DIFFERENTIAL/PLATELET
Abs Immature Granulocytes: 0.03 10*3/uL (ref 0.00–0.07)
BASOS PCT: 1 %
Basophils Absolute: 0.1 10*3/uL (ref 0.0–0.1)
EOS ABS: 0.2 10*3/uL (ref 0.0–0.5)
EOS PCT: 4 %
HCT: 43.1 % (ref 36.0–46.0)
HEMOGLOBIN: 13.6 g/dL (ref 12.0–15.0)
Immature Granulocytes: 1 %
LYMPHS PCT: 29 %
Lymphs Abs: 1.9 10*3/uL (ref 0.7–4.0)
MCH: 27.5 pg (ref 26.0–34.0)
MCHC: 31.6 g/dL (ref 30.0–36.0)
MCV: 87.2 fL (ref 80.0–100.0)
MONO ABS: 0.4 10*3/uL (ref 0.1–1.0)
Monocytes Relative: 6 %
Neutro Abs: 4 10*3/uL (ref 1.7–7.7)
Neutrophils Relative %: 59 %
Platelets: 372 10*3/uL (ref 150–400)
RBC: 4.94 MIL/uL (ref 3.87–5.11)
RDW: 13.7 % (ref 11.5–15.5)
WBC: 6.6 10*3/uL (ref 4.0–10.5)
nRBC: 0 % (ref 0.0–0.2)

## 2018-06-23 SURGERY — LAPAROSCOPIC CHOLECYSTECTOMY WITH INTRAOPERATIVE CHOLANGIOGRAM
Anesthesia: General | Site: Abdomen

## 2018-06-23 MED ORDER — SODIUM CHLORIDE 0.9 % IR SOLN
Status: DC | PRN
  Administered 2018-06-23 (×2): 1

## 2018-06-23 MED ORDER — FENTANYL CITRATE (PF) 250 MCG/5ML IJ SOLN
INTRAMUSCULAR | Status: AC
  Filled 2018-06-23: qty 5

## 2018-06-23 MED ORDER — LACTATED RINGERS IV SOLN: INTRAVENOUS | Status: DC

## 2018-06-23 MED ORDER — SCOPOLAMINE 1 MG/3DAYS TD PT72
MEDICATED_PATCH | TRANSDERMAL | Status: DC | PRN
  Administered 2018-06-23: 1 via TRANSDERMAL

## 2018-06-23 MED ORDER — SCOPOLAMINE 1 MG/3DAYS TD PT72
MEDICATED_PATCH | TRANSDERMAL | Status: AC
  Filled 2018-06-23: qty 1

## 2018-06-23 MED ORDER — MIDAZOLAM HCL 5 MG/5ML IJ SOLN
INTRAMUSCULAR | Status: DC | PRN
  Administered 2018-06-23: 2 mg via INTRAVENOUS

## 2018-06-23 MED ORDER — ROCURONIUM BROMIDE 50 MG/5ML IV SOSY
PREFILLED_SYRINGE | INTRAVENOUS | Status: AC
  Filled 2018-06-23: qty 5

## 2018-06-23 MED ORDER — LACTATED RINGERS IV SOLN
INTRAVENOUS | Status: DC
  Administered 2018-06-23: 09:00:00 via INTRAVENOUS

## 2018-06-23 MED ORDER — IOPAMIDOL (ISOVUE-300) INJECTION 61%
INTRAVENOUS | Status: AC
  Filled 2018-06-23: qty 50

## 2018-06-23 MED ORDER — CHLORHEXIDINE GLUCONATE CLOTH 2 % EX PADS: 6.0000 | MEDICATED_PAD | Freq: Once | CUTANEOUS | Status: DC

## 2018-06-23 MED ORDER — LIDOCAINE 2% (20 MG/ML) 5 ML SYRINGE
INTRAMUSCULAR | Status: AC
  Filled 2018-06-23: qty 5

## 2018-06-23 MED ORDER — CELECOXIB 200 MG PO CAPS
200.0000 mg | ORAL_CAPSULE | ORAL | Status: AC
  Administered 2018-06-23: 200 mg via ORAL
  Filled 2018-06-23: qty 1

## 2018-06-23 MED ORDER — PROMETHAZINE HCL 25 MG/ML IJ SOLN
INTRAMUSCULAR | Status: AC
  Administered 2018-06-23: 6.25 mg via INTRAVENOUS
  Filled 2018-06-23: qty 1

## 2018-06-23 MED ORDER — MEPERIDINE HCL 50 MG/ML IJ SOLN: 6.2500 mg | INTRAMUSCULAR | Status: DC | PRN

## 2018-06-23 MED ORDER — ONDANSETRON HCL 4 MG/2ML IJ SOLN
INTRAMUSCULAR | Status: DC | PRN
  Administered 2018-06-23: 4 mg via INTRAVENOUS

## 2018-06-23 MED ORDER — PROPOFOL 10 MG/ML IV BOLUS
INTRAVENOUS | Status: DC | PRN
  Administered 2018-06-23: 140 mg via INTRAVENOUS

## 2018-06-23 MED ORDER — ROCURONIUM BROMIDE 50 MG/5ML IV SOSY
PREFILLED_SYRINGE | INTRAVENOUS | Status: DC | PRN
  Administered 2018-06-23: 50 mg via INTRAVENOUS

## 2018-06-23 MED ORDER — DEXAMETHASONE SODIUM PHOSPHATE 10 MG/ML IJ SOLN
INTRAMUSCULAR | Status: AC
  Filled 2018-06-23: qty 1

## 2018-06-23 MED ORDER — ONDANSETRON HCL 4 MG/2ML IJ SOLN
INTRAMUSCULAR | Status: AC
  Filled 2018-06-23: qty 2

## 2018-06-23 MED ORDER — ACETAMINOPHEN 500 MG PO TABS
1000.0000 mg | ORAL_TABLET | ORAL | Status: AC
  Administered 2018-06-23: 1000 mg via ORAL
  Filled 2018-06-23: qty 2

## 2018-06-23 MED ORDER — MIDAZOLAM HCL 2 MG/2ML IJ SOLN
INTRAMUSCULAR | Status: AC
  Filled 2018-06-23: qty 2

## 2018-06-23 MED ORDER — FENTANYL CITRATE (PF) 100 MCG/2ML IJ SOLN
INTRAMUSCULAR | Status: DC | PRN
  Administered 2018-06-23: 100 ug via INTRAVENOUS
  Administered 2018-06-23 (×4): 50 ug via INTRAVENOUS

## 2018-06-23 MED ORDER — GABAPENTIN 300 MG PO CAPS
300.0000 mg | ORAL_CAPSULE | ORAL | Status: AC
  Administered 2018-06-23: 300 mg via ORAL
  Filled 2018-06-23: qty 1

## 2018-06-23 MED ORDER — SUGAMMADEX SODIUM 200 MG/2ML IV SOLN
INTRAVENOUS | Status: DC | PRN
  Administered 2018-06-23: 200 mg via INTRAVENOUS

## 2018-06-23 MED ORDER — 0.9 % SODIUM CHLORIDE (POUR BTL) OPTIME
TOPICAL | Status: DC | PRN
  Administered 2018-06-23: 1000 mL

## 2018-06-23 MED ORDER — POTASSIUM CHLORIDE 10 MEQ/100ML IV SOLN
10.0000 meq | INTRAVENOUS | Status: AC
  Administered 2018-06-23 (×2): 10 meq via INTRAVENOUS

## 2018-06-23 MED ORDER — PHENYLEPHRINE 40 MCG/ML (10ML) SYRINGE FOR IV PUSH (FOR BLOOD PRESSURE SUPPORT)
PREFILLED_SYRINGE | INTRAVENOUS | Status: AC
  Filled 2018-06-23: qty 10

## 2018-06-23 MED ORDER — PROMETHAZINE HCL 25 MG/ML IJ SOLN
6.2500 mg | INTRAMUSCULAR | Status: AC | PRN
  Administered 2018-06-23 (×2): 6.25 mg via INTRAVENOUS

## 2018-06-23 MED ORDER — LIDOCAINE 2% (20 MG/ML) 5 ML SYRINGE
INTRAMUSCULAR | Status: DC | PRN
  Administered 2018-06-23: 40 mg via INTRAVENOUS

## 2018-06-23 MED ORDER — BUPIVACAINE-EPINEPHRINE 0.25% -1:200000 IJ SOLN
INTRAMUSCULAR | Status: DC | PRN
  Administered 2018-06-23: 12 mL

## 2018-06-23 MED ORDER — DEXAMETHASONE SODIUM PHOSPHATE 10 MG/ML IJ SOLN
INTRAMUSCULAR | Status: DC | PRN
  Administered 2018-06-23: 10 mg via INTRAVENOUS

## 2018-06-23 MED ORDER — PROPOFOL 10 MG/ML IV BOLUS
INTRAVENOUS | Status: AC
  Filled 2018-06-23: qty 20

## 2018-06-23 MED ORDER — FENTANYL CITRATE (PF) 100 MCG/2ML IJ SOLN: 25.0000 ug | INTRAMUSCULAR | Status: DC | PRN

## 2018-06-23 MED ORDER — LACTATED RINGERS IV SOLN
INTRAVENOUS | Status: DC | PRN
  Administered 2018-06-23 (×2): via INTRAVENOUS

## 2018-06-23 MED ORDER — OXYCODONE HCL 5 MG PO TABS: 5.0000 mg | ORAL_TABLET | Freq: Four times a day (QID) | ORAL | 0 refills | Status: DC | PRN

## 2018-06-23 MED ORDER — IBUPROFEN 800 MG PO TABS: 800.0000 mg | ORAL_TABLET | Freq: Three times a day (TID) | ORAL | 0 refills | Status: DC | PRN

## 2018-06-23 MED ORDER — BUPIVACAINE-EPINEPHRINE (PF) 0.25% -1:200000 IJ SOLN
INTRAMUSCULAR | Status: AC
  Filled 2018-06-23: qty 30

## 2018-06-23 SURGICAL SUPPLY — 48 items
ADH SKN CLS APL DERMABOND .7 (GAUZE/BANDAGES/DRESSINGS) ×1
APPLIER CLIP ROT 10 11.4 M/L (STAPLE) ×3
APR CLP MED LRG 11.4X10 (STAPLE) ×1
BAG SPEC RTRVL 10 TROC 200 (ENDOMECHANICALS) ×1
BLADE CLIPPER SURG (BLADE) IMPLANT
CANISTER SUCT 3000ML PPV (MISCELLANEOUS) ×3 IMPLANT
CHLORAPREP W/TINT 26ML (MISCELLANEOUS) ×3 IMPLANT
CLIP APPLIE ROT 10 11.4 M/L (STAPLE) ×1 IMPLANT
COVER MAYO STAND STRL (DRAPES) ×1 IMPLANT
COVER SURGICAL LIGHT HANDLE (MISCELLANEOUS) ×3 IMPLANT
COVER WAND RF STERILE (DRAPES) ×1 IMPLANT
DERMABOND ADVANCED (GAUZE/BANDAGES/DRESSINGS) ×2
DERMABOND ADVANCED .7 DNX12 (GAUZE/BANDAGES/DRESSINGS) ×1 IMPLANT
DRAPE C-ARM 42X72 X-RAY (DRAPES) ×1 IMPLANT
DRAPE WARM FLUID 44X44 (DRAPE) ×3 IMPLANT
ELECT REM PT RETURN 9FT ADLT (ELECTROSURGICAL) ×3
ELECTRODE REM PT RTRN 9FT ADLT (ELECTROSURGICAL) ×1 IMPLANT
GLOVE BIO SURGEON STRL SZ8 (GLOVE) ×3 IMPLANT
GLOVE BIOGEL PI IND STRL 7.5 (GLOVE) IMPLANT
GLOVE BIOGEL PI IND STRL 8 (GLOVE) ×1 IMPLANT
GLOVE BIOGEL PI INDICATOR 7.5 (GLOVE) ×2
GLOVE BIOGEL PI INDICATOR 8 (GLOVE) ×6
GLOVE SURG SS PI 7.5 STRL IVOR (GLOVE) ×2 IMPLANT
GLOVE SURG SS PI 8.0 STRL IVOR (GLOVE) ×4 IMPLANT
GOWN STRL REUS W/ TWL LRG LVL3 (GOWN DISPOSABLE) ×2 IMPLANT
GOWN STRL REUS W/ TWL XL LVL3 (GOWN DISPOSABLE) ×1 IMPLANT
GOWN STRL REUS W/TWL LRG LVL3 (GOWN DISPOSABLE) ×6
GOWN STRL REUS W/TWL XL LVL3 (GOWN DISPOSABLE) ×6
KIT BASIN OR (CUSTOM PROCEDURE TRAY) ×3 IMPLANT
KIT TURNOVER KIT B (KITS) ×3 IMPLANT
NS IRRIG 1000ML POUR BTL (IV SOLUTION) ×3 IMPLANT
PAD ARMBOARD 7.5X6 YLW CONV (MISCELLANEOUS) ×3 IMPLANT
POUCH RETRIEVAL ECOSAC 10 (ENDOMECHANICALS) ×1 IMPLANT
POUCH RETRIEVAL ECOSAC 10MM (ENDOMECHANICALS) ×2
SCISSORS LAP 5X35 DISP (ENDOMECHANICALS) ×3 IMPLANT
SET CHOLANGIOGRAPH 5 50 .035 (SET/KITS/TRAYS/PACK) ×1 IMPLANT
SET IRRIG TUBING LAPAROSCOPIC (IRRIGATION / IRRIGATOR) ×3 IMPLANT
SLEEVE ENDOPATH XCEL 5M (ENDOMECHANICALS) ×3 IMPLANT
SPECIMEN JAR SMALL (MISCELLANEOUS) ×3 IMPLANT
SUT MNCRL AB 4-0 PS2 18 (SUTURE) ×3 IMPLANT
TOWEL OR 17X24 6PK STRL BLUE (TOWEL DISPOSABLE) ×3 IMPLANT
TOWEL OR 17X26 10 PK STRL BLUE (TOWEL DISPOSABLE) ×3 IMPLANT
TRAY LAPAROSCOPIC MC (CUSTOM PROCEDURE TRAY) ×3 IMPLANT
TROCAR XCEL BLUNT TIP 100MML (ENDOMECHANICALS) ×3 IMPLANT
TROCAR XCEL NON-BLD 11X100MML (ENDOMECHANICALS) ×3 IMPLANT
TROCAR XCEL NON-BLD 5MMX100MML (ENDOMECHANICALS) ×3 IMPLANT
TUBING INSUFFLATION (TUBING) ×3 IMPLANT
WATER STERILE IRR 1000ML POUR (IV SOLUTION) ×3 IMPLANT

## 2018-06-23 NOTE — Op Note (Signed)
Laparoscopic Cholecystectomy   Indications: This patient presents with symptomatic gallbladder disease and will undergo laparoscopic cholecystectomy. The procedure has been discussed with the patient. Operative and non operative treatments have been discussed. Risks of surgery include bleeding, infection,  Common bile duct injury,  Injury to the stomach,liver, colon,small intestine, abdominal wall,  Diaphragm,  Major blood vessels,  And the need for an open procedure.  Other risks include worsening of medical problems, death,  DVT and pulmonary embolism, and cardiovascular events.   Medical options have also been discussed. The patient has been informed of long term expectations of surgery and non surgical options,  The patient agrees to proceed.    Pre-operative Diagnosis:BILARY DYSKINESIA   Post-operative Diagnosis: SAME   Surgeon: Joyice Faster Zakariyah Freimark   Assistants: OR staff   Anesthesia: General endotracheal anesthesia and Local anesthesia 0.25.% bupivacaine, with epinephrine  ASA Class: 2  Procedure Details  The patient was seen again in the Holding Room. The risks, benefits, complications, treatment options, and expected outcomes were discussed with the patient. The possibilities of reaction to medication, pulmonary aspiration, perforation of viscus, bleeding, recurrent infection, finding a normal gallbladder, the need for additional procedures, failure to diagnose a condition, the possible need to convert to an open procedure, and creating a complication requiring transfusion or operation were discussed with the patient. The patient and/or family concurred with the proposed plan, giving informed consent. The site of surgery properly noted/marked. The patient was taken to Operating Room, identified as Cynthia Tapia and the procedure verified as Laparoscopic Cholecystectomy with Intraoperative Cholangiograms. A Time Out was held and the above information confirmed.  Prior to the induction of  general anesthesia, antibiotic prophylaxis was administered. General endotracheal anesthesia was then administered and tolerated well. After the induction, the abdomen was prepped in the usual sterile fashion. The patient was positioned in the supine position with the left arm comfortably tucked, along with some reverse Trendelenburg.  Local anesthetic agent was injected into the skin near the umbilicus and an incision made. The midline fascia was incised and the Hasson technique was used to introduce a 12 mm port under direct vision. It was secured with a figure of eight Vicryl suture placed in the usual fashion. Pneumoperitoneum was then created with CO2 and tolerated well without any adverse changes in the patient's vital signs. Additional trocars were introduced under direct vision with an 11 mm trocar in the epigastrium and 2 5 mm trocars in the right upper quadrant. All skin incisions were infiltrated with a local anesthetic agent before making the incision and placing the trocars.   The gallbladder was identified, the fundus grasped and retracted cephalad. Adhesions were lysed bluntly and with the electrocautery where indicated, taking care not to injure any adjacent organs or viscus. The infundibulum was grasped and retracted laterally, exposing the peritoneum overlying the triangle of Calot. This was then divided and exposed in a blunt fashion. The cystic duct was clearly identified and bluntly dissected circumferentially. The junctions of the gallbladder, cystic duct and common bile duct were clearly identified prior to the division of any linear structure.   An incision was made in the cystic duct and the cholangiogram catheter introduced. The catheter was secured using an endoclip. The study showed no stones and good visualization of the distal and proximal biliary tree. The catheter was then removed.   The cystic duct was then  ligated with surgical clips  on the patient side and  clipped on the  gallbladder side  and divided. The cystic artery was identified, dissected free, ligated with clips and divided as well. Posterior cystic artery clipped and divided.  The gallbladder was dissected from the liver bed in retrograde fashion with the electrocautery. The gallbladder was removed. The liver bed was irrigated and inspected. Hemostasis was achieved with the electrocautery. Copious irrigation was utilized and was repeatedly aspirated until clear all particulate matter. Hemostasis was achieved with no signs  Of bleeding or bile leakage.  Pneumoperitoneum was completely reduced after viewing removal of the trocars under direct vision. The wound was thoroughly irrigated and the fascia was then closed with a figure of eight suture; the skin was then closed with 4 0 MONOCRYL  and a sterile dressing was applied.  Instrument, sponge, and needle counts were correct at closure and at the conclusion of the case.   Findings: Cholecystitis   Estimated Blood Loss: less than 50 mL         Drains: none          Total IV Fluids: per anesthesia record          Specimens: Gallbladder           Complications: None; patient tolerated the procedure well.         Disposition: PACU - hemodynamically stable.         Condition: stable

## 2018-06-23 NOTE — Anesthesia Preprocedure Evaluation (Addendum)
Anesthesia Evaluation  Patient identified by MRN, date of birth, ID band Patient awake    Reviewed: Allergy & Precautions, NPO status , Patient's Chart, lab work & pertinent test results  Airway Mallampati: II  TM Distance: >3 FB     Dental  (+) Dental Advisory Given, Teeth Intact   Pulmonary    breath sounds clear to auscultation       Cardiovascular hypertension, Pt. on medications  Rhythm:Regular Rate:Normal + Systolic murmurs    Neuro/Psych  Headaches, Anxiety    GI/Hepatic Neg liver ROS, hiatal hernia, GERD  Medicated,  Endo/Other  negative endocrine ROS  Renal/GU negative Renal ROS     Musculoskeletal negative musculoskeletal ROS (+)   Abdominal Normal abdominal exam  (+)   Peds  Hematology negative hematology ROS (+)   Anesthesia Other Findings - HLD  Reproductive/Obstetrics                           Lab Results  Component Value Date   WBC 7.5 06/06/2018   HGB 14.0 06/06/2018   HCT 43.9 06/06/2018   MCV 88.2 06/06/2018   PLT 339 06/06/2018   Lab Results  Component Value Date   CREATININE 0.81 06/06/2018   BUN 8 06/06/2018   NA 141 06/06/2018   K 3.0 (L) 06/06/2018   CL 104 06/06/2018   CO2 24 06/06/2018   Lab Results  Component Value Date   INR 0.94 12/18/2014   EKG: normal sinus rhythm.  Anesthesia Physical Anesthesia Plan  ASA: II  Anesthesia Plan: General   Post-op Pain Management:    Induction: Intravenous  PONV Risk Score and Plan: 4 or greater and Ondansetron, Dexamethasone, Midazolam and Scopolamine patch - Pre-op  Airway Management Planned: Oral ETT  Additional Equipment: None  Intra-op Plan:   Post-operative Plan: Extubation in OR  Informed Consent: I have reviewed the patients History and Physical, chart, labs and discussed the procedure including the risks, benefits and alternatives for the proposed anesthesia with the patient or authorized  representative who has indicated his/her understanding and acceptance.   Dental advisory given  Plan Discussed with: CRNA  Anesthesia Plan Comments:        Anesthesia Quick Evaluation

## 2018-06-23 NOTE — Discharge Instructions (Signed)
[facility] ______[facility] °LAPAROSCOPIC SURGERY: POST OP INSTRUCTIONS °Always review your discharge instruction sheet given to you by the facility where your surgery was performed. °IF YOU HAVE DISABILITY OR FAMILY LEAVE FORMS, YOU MUST BRING THEM TO THE OFFICE FOR PROCESSING.   °DO NOT GIVE THEM TO YOUR DOCTOR. ° °1. A prescription for pain medication may be given to you upon discharge.  Take your pain medication as prescribed, if needed.  If narcotic pain medicine is not needed, then you may take acetaminophen (Tylenol) or ibuprofen (Advil) as needed. °2. Take your usually prescribed medications unless otherwise directed. °3. If you need a refill on your pain medication, please contact your pharmacy.  They will contact our office to request authorization. Prescriptions will not be filled after 5pm or on week-ends. °4. You should follow a light diet the first few days after arrival home, such as soup and crackers, etc.  Be sure to include lots of fluids daily. °5. Most patients will experience some swelling and bruising in the area of the incisions.  Ice packs will help.  Swelling and bruising can take several days to resolve.  °6. It is common to experience some constipation if taking pain medication after surgery.  Increasing fluid intake and taking a stool softener (such as Colace) will usually help or prevent this problem from occurring.  A mild laxative (Milk of Magnesia or Miralax) should be taken according to package instructions if there are no bowel movements after 48 hours. °7. Unless discharge instructions indicate otherwise, you may remove your bandages 24-48 hours after surgery, and you may shower at that time.  You may have steri-strips (small skin tapes) in place directly over the incision.  These strips should be left on the skin for 7-10 days.  If your surgeon used skin glue on the incision, you may shower in 24 hours.  The glue will flake off over the next 2-3 weeks.  Any sutures or  staples will be removed at the office during your follow-up visit. °8. ACTIVITIES:  You may resume regular (light) daily activities beginning the next day--such as daily self-care, walking, climbing stairs--gradually increasing activities as tolerated.  You may have sexual intercourse when it is comfortable.  Refrain from any heavy lifting or straining until approved by your doctor. °a. You may drive when you are no longer taking prescription pain medication, you can comfortably wear a seatbelt, and you can safely maneuver your car and apply brakes. °b. RETURN TO WORK:  __________________________________________________________ °9. You should see your doctor in the office for a follow-up appointment approximately 2-3 weeks after your surgery.  Make sure that you call for this appointment within a day or two after you arrive home to insure a convenient appointment time. °10. OTHER INSTRUCTIONS: __________________________________________________________________________________________________________________________ __________________________________________________________________________________________________________________________ °WHEN TO CALL YOUR DOCTOR: °1. Fever over 101.0 °2. Inability to urinate °3. Continued bleeding from incision. °4. Increased pain, redness, or drainage from the incision. °5. Increasing abdominal pain ° °The clinic staff is available to answer your questions during regular business hours.  Please don’t hesitate to call and ask to speak to one of the nurses for clinical concerns.  If you have a medical emergency, go to the nearest emergency room or call 911.  A surgeon from [facility] is always on call at the hospital. °[address], [address], [place], [place]  [address] ? [address] [address], [place], [place]   [address] °[phone] ? [phone] ? FAX [phone] °Web site:   [email or web address] °

## 2018-06-23 NOTE — Anesthesia Procedure Notes (Signed)
Procedure Name: Intubation Date/Time: 06/23/2018 10:31 AM Performed by: Orlie Dakin, CRNA Pre-anesthesia Checklist: Patient identified, Emergency Drugs available, Suction available and Patient being monitored Patient Re-evaluated:Patient Re-evaluated prior to induction Oxygen Delivery Method: Circle system utilized Preoxygenation: Pre-oxygenation with 100% oxygen Induction Type: IV induction Ventilation: Mask ventilation without difficulty Laryngoscope Size: 3 Grade View: Grade I Tube type: Oral Tube size: 7.0 mm Number of attempts: 1 Airway Equipment and Method: Stylet Placement Confirmation: ETT inserted through vocal cords under direct vision,  positive ETCO2 and breath sounds checked- equal and bilateral Secured at: 23 cm Tube secured with: Tape Dental Injury: Teeth and Oropharynx as per pre-operative assessment  Comments: Noted small amount brownish liquid in post pharynx with DL.  Suctioned, vocal cords and glottic opening clear.  After DL, ETT suctioned for scant amount brownish liquid.  Dr Smith Robert aware and present.  4x4s bite block used at end of case.

## 2018-06-23 NOTE — Interval H&P Note (Signed)
History and Physical Interval Note:  06/23/2018 9:36 AM  Cynthia Tapia  has presented today for surgery, with the diagnosis of Bilinary dyskinesia  The various methods of treatment have been discussed with the patient and family. After consideration of risks, benefits and other options for treatment, the patient has consented to  Procedure(s): LAPAROSCOPIC CHOLECYSTECTOMY WITH INTRAOPERATIVE CHOLANGIOGRAM ERAS PATHWAY (N/A) as a surgical intervention .  The patient's history has been reviewed, patient examined, no change in status, stable for surgery.  I have reviewed the patient's chart and labs.  Questions were answered to the patient's satisfaction.     [place]

## 2018-06-23 NOTE — Anesthesia Postprocedure Evaluation (Signed)
Anesthesia Post Note  Patient: Cynthia Tapia  Procedure(s) Performed: LAPAROSCOPIC CHOLECYSTECTOMY (N/A Abdomen)     Patient location during evaluation: PACU Anesthesia Type: General Level of consciousness: awake and alert Pain management: pain level controlled Vital Signs Assessment: post-procedure vital signs reviewed and stable Respiratory status: spontaneous breathing, nonlabored ventilation, respiratory function stable and patient connected to nasal cannula oxygen Cardiovascular status: blood pressure returned to baseline and stable Anesthetic complications: no Comments: Nausea improving with medications.     Last Vitals:  Vitals:   06/23/18 1218 06/23/18 1230  BP: 93/60 (!) 112/57  Pulse: 88 80  Resp: 15 16  Temp: 36.5 C   SpO2: 95% 97%    Last Pain:  Vitals:   06/23/18 1230  TempSrc:   PainSc: 0-No pain                 Effie Berkshire

## 2018-06-23 NOTE — Transfer of Care (Signed)
Immediate Anesthesia Transfer of Care Note  Patient: Cynthia Tapia  Procedure(s) Performed: LAPAROSCOPIC CHOLECYSTECTOMY (N/A Abdomen)  Patient Location: PACU  Anesthesia Type:General  Level of Consciousness: awake, oriented and patient cooperative  Airway & Oxygen Therapy: Patient Spontanous Breathing and Patient connected to face mask oxygen  Post-op Assessment: Report given to RN and Post -op Vital signs reviewed and stable  Post vital signs: Reviewed and stable  Last Vitals:  Vitals Value Taken Time  BP 104/57 06/23/2018 11:35 AM  Temp    Pulse 80 06/23/2018 11:37 AM  Resp 17 06/23/2018 11:37 AM  SpO2 100 % 06/23/2018 11:37 AM  Vitals shown include unvalidated device data.  Last Pain:  Vitals:   06/23/18 0904  TempSrc:   PainSc: 0-No pain      Patients Stated Pain Goal: 3 (02/63/78 5885)  Complications: No apparent anesthesia complications

## 2018-06-24 ENCOUNTER — Encounter (HOSPITAL_COMMUNITY): Payer: Self-pay | Admitting: Surgery

## 2019-05-26 IMAGING — CT CT ABD-PELV W/ CM
2 of 5 series · 17 of 46 positions shown, 19 images · IV contrast (Omni 300)
Comparison: 06/06/2018 right upper quadrant ultrasound.

CLINICAL DATA: 52 y/o F; worsening right upper quadrant abdominal
pain.

EXAM:
CT ABDOMEN AND PELVIS WITH CONTRAST
TECHNIQUE: Multidetector CT imaging of the abdomen and pelvis was performed
using the standard protocol following bolus administration of
intravenous contrast.
CONTRAST:  100mL OMNIPAQUE IOHEXOL 300 MG/ML  SOLN

[Series 3: a/p w/ 5mm · axial · 0.97mm/px · z∈[+575,+1085]mm · 14 of 114 slices shown, 16 images]
[im 6/114  soft-tissue]
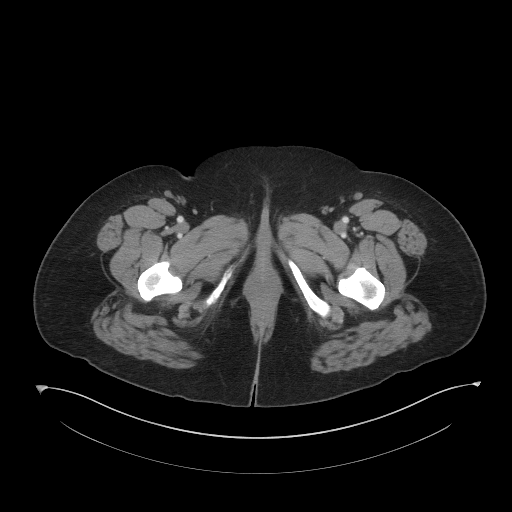
[im 6/114  bone]
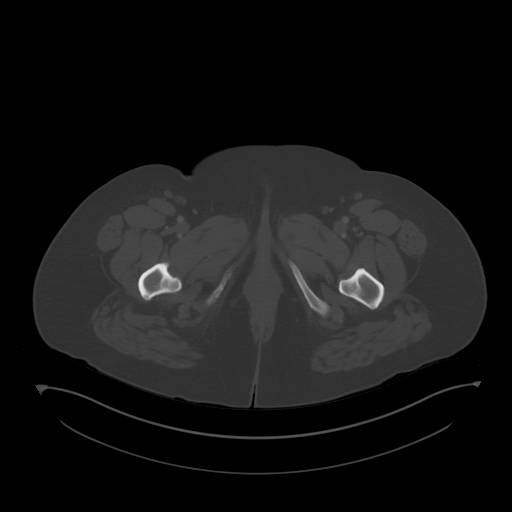
[im 12/114  soft-tissue]
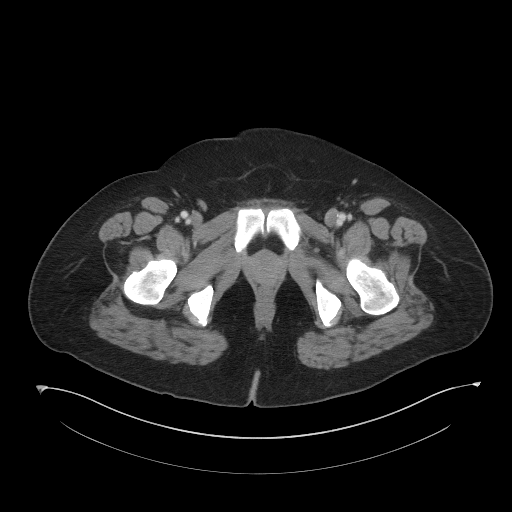
[im 24/114  soft-tissue]
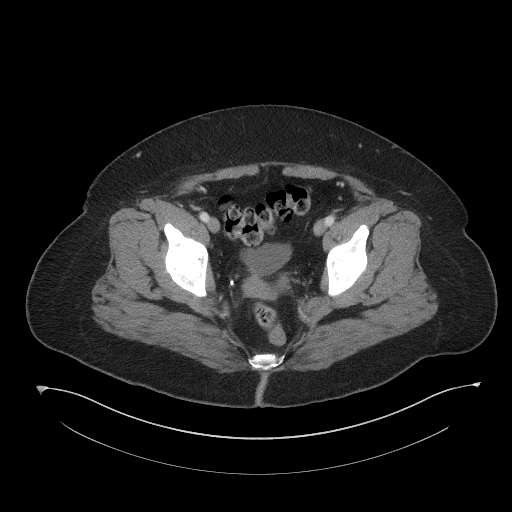
[im 30/114  soft-tissue]
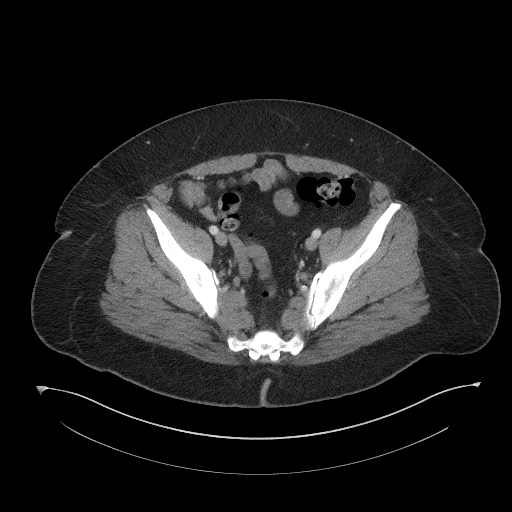
[im 36/114  soft-tissue]
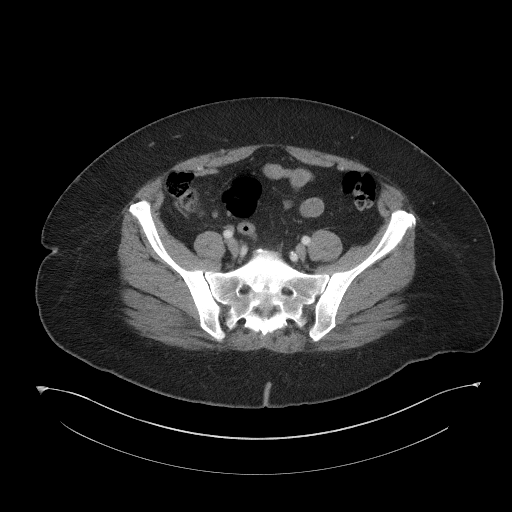
[im 48/114  soft-tissue]
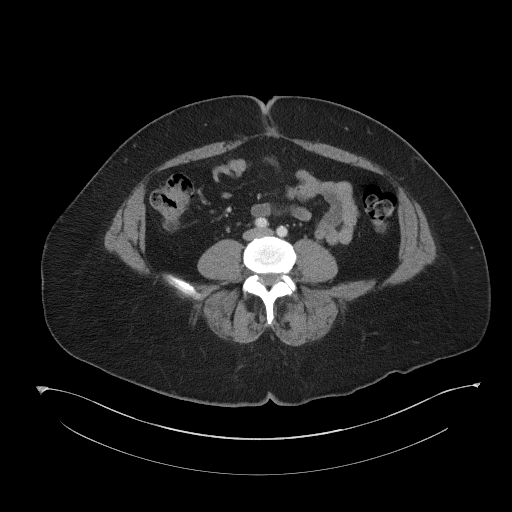
[im 54/114  soft-tissue]
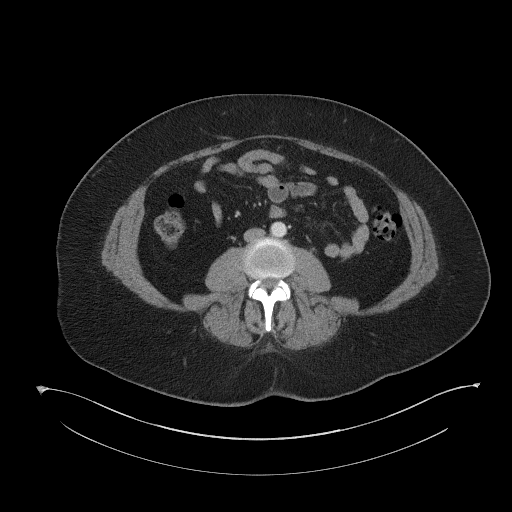
[im 60/114  soft-tissue]
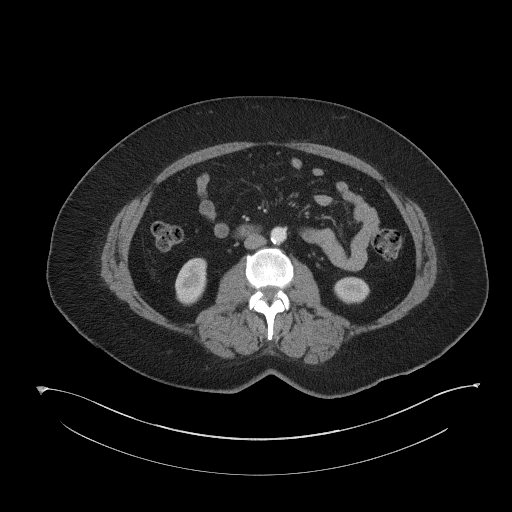
[im 66/114  soft-tissue]
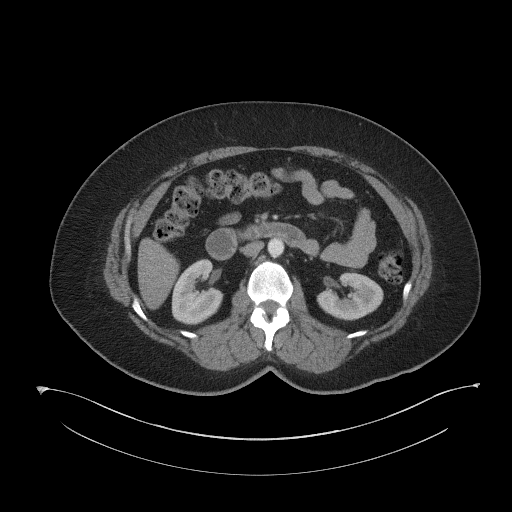
[im 66/114  bone]
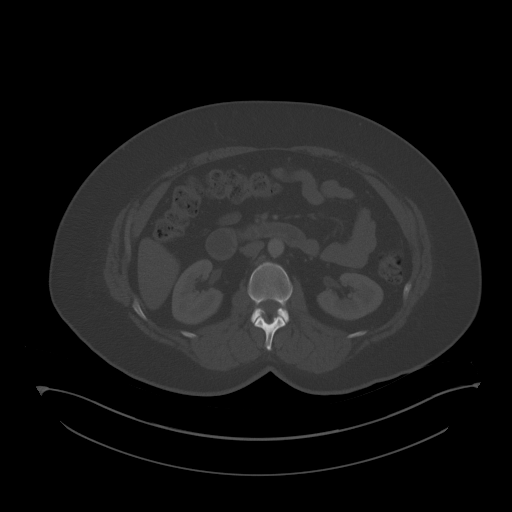
[im 78/114  soft-tissue]
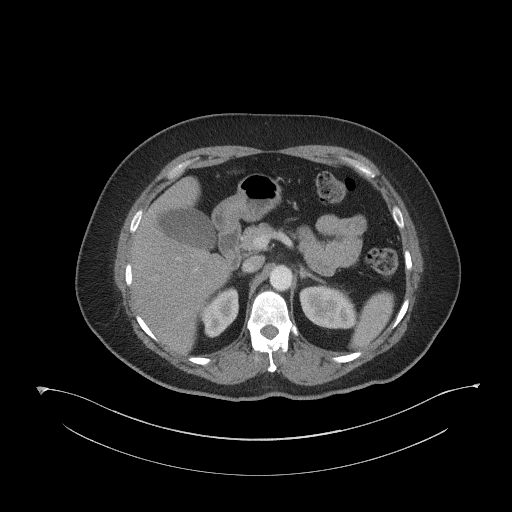
[im 84/114  soft-tissue]
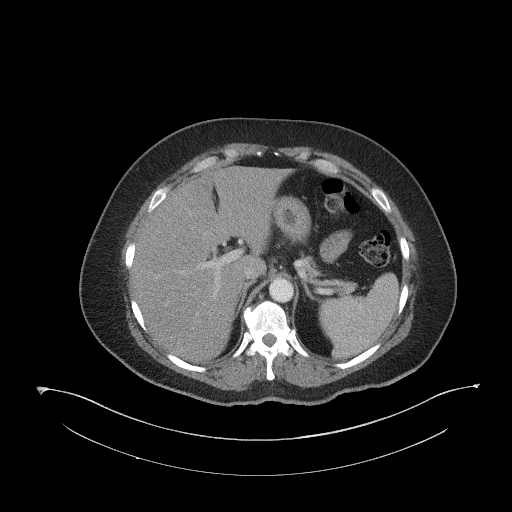
[im 90/114  soft-tissue]
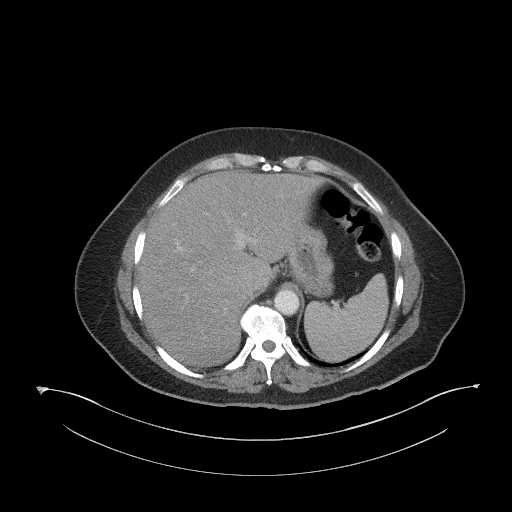
[im 102/114  soft-tissue]
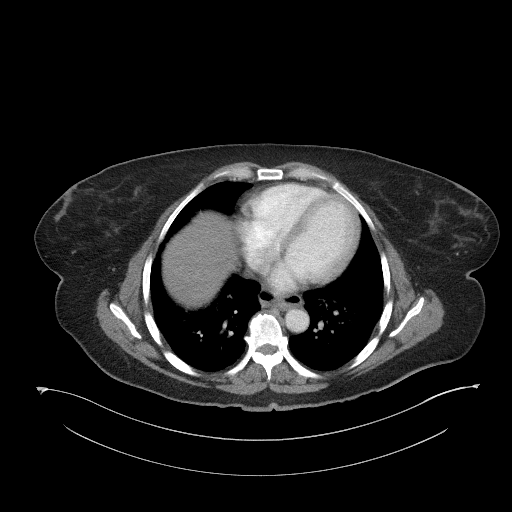
[im 108/114  soft-tissue]
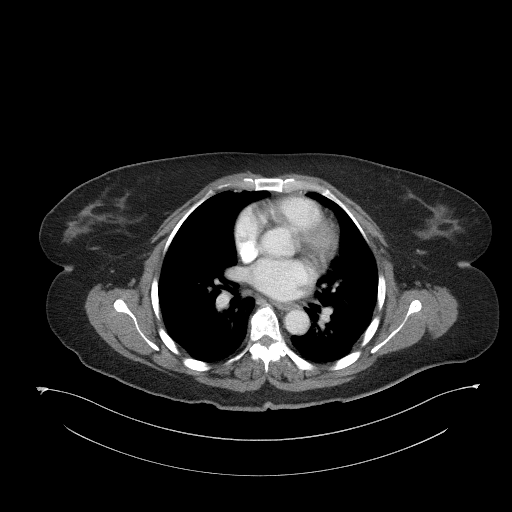

[Series 6: a/p w/ cor · coronal · 1.11mm/px · 3 of 180 slices shown]
[im 60/180  soft-tissue]
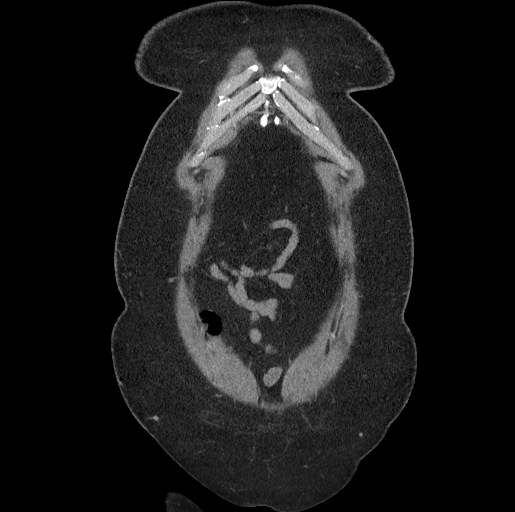
[im 80/180  soft-tissue]
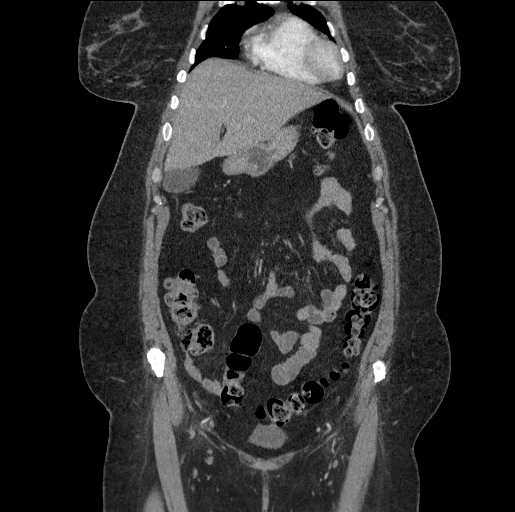
[im 100/180  soft-tissue]
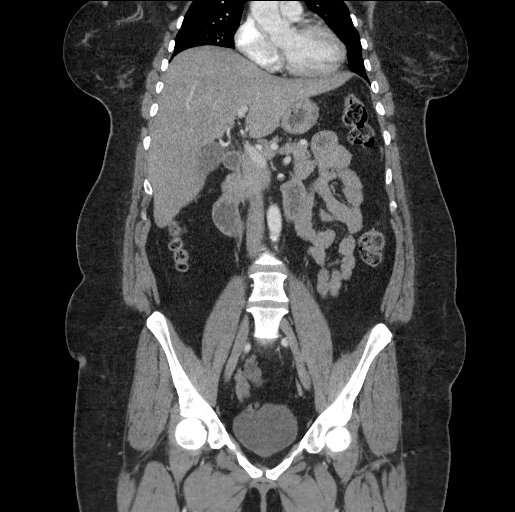

[17 of 46 positions shown; findings below may reference images not displayed]

FINDINGS: Lower chest: No acute abnormality.

Hepatobiliary: No focal liver abnormality is seen. No gallstones,
gallbladder wall thickening, or biliary dilatation.

Pancreas: Unremarkable. No pancreatic ductal dilatation or
surrounding inflammatory changes.

Spleen: Normal in size without focal abnormality.

Adrenals/Urinary Tract: Normal adrenal glands. 14 mm left kidney
lower pole angiomyolipoma. Probable 7 mm angiomyolipoma of the right
kidney upper pole (series 6, image 144). No urinary stone disease.
No hydronephrosis. Normal bladder.

Stomach/Bowel: Small hiatal hernia.. Appendix appears normal. No
evidence of bowel wall thickening, distention, or inflammatory
changes.

Vascular/Lymphatic: Aortic atherosclerosis. No enlarged abdominal or
pelvic lymph nodes.

Reproductive: Status post hysterectomy. Simple 2.1 cm right adnexal
cyst.

Other: No abdominal wall hernia or abnormality. No abdominopelvic
ascites.

Musculoskeletal: No fracture is seen. Mild multilevel discogenic
degenerative changes of visible thoracic and lumbar spine.
IMPRESSION: 1. No acute process identified.
2. Left kidney 14 mm lower pole angiomyolipoma and probable 7 mm
right kidney upper pole angiomyolipoma.
3.  Aortic Atherosclerosis ([id number]).
4. Mild degenerative changes of the spine.

By: Ddren Culetto M.D.

## 2019-06-03 IMAGING — NM NM HEPATO W/GB/PHARM/[PERSON_NAME]
1 series · 12 of 12 positions shown · non-contrast
Comparison: None.

CLINICAL DATA: Right upper quadrant pain with nausea and vomiting

EXAM:
NUCLEAR MEDICINE HEPATOBILIARY IMAGING WITH GALLBLADDER EF
VIEWS:
Anterior right upper quadrant
RADIOPHARMACEUTICALS:  5.2 mCi 2c-IIm  Choletec IV

[Series 1: hepato · 4.46mm/px · 2 acquisitions, 12 frames shown]
[im 1/2]
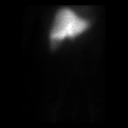
[im 1/2]
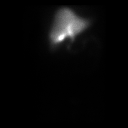
[im 1/2]
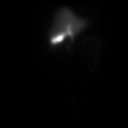
[im 1/2]
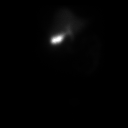
[im 1/2]
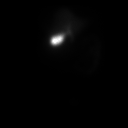
[im 1/2]
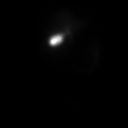
[im 2/2]
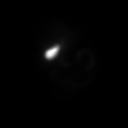
[im 2/2]
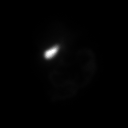
[im 2/2]
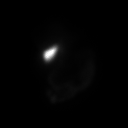
[im 2/2]
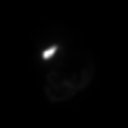
[im 2/2]
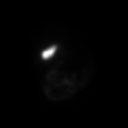
[im 2/2]
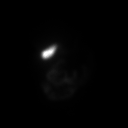

[12 of 12 positions shown; findings below may reference images not displayed]

FINDINGS: Liver uptake of radiotracer is normal. There is prompt visualization
of gallbladder and small bowel, indicating patency of the cystic and
common bile ducts. The patient consumed 8 ounces of Ensure orally
with calculation of the computer generated ejection fraction of
radiotracer from the gallbladder. The patient experienced abdominal
pain with the oral Ensure consumption. The computer generated
ejection fraction of radiotracer from the gallbladder is abnormally
low at 12.3%, normal greater than 33% using the oral agent.
IMPRESSION: Abnormally low ejection fraction of radiotracer from the
gallbladder, a finding indicative of biliary dyskinesia. Patient did
experience pain with the oral Ensure consumption. Cystic and common
bile ducts are patent as is evidenced by visualization of
gallbladder and small bowel.

## 2020-08-28 ENCOUNTER — Ambulatory Visit
Admission: EM | Admit: 2020-08-28 | Discharge: 2020-08-28 | Disposition: A | Discharge status: Home / Self Care | Payer: BC Managed Care – PPO | Attending: Emergency Medicine | Admitting: Emergency Medicine

## 2020-08-28 DIAGNOSIS — Z1152 Encounter for screening for COVID-19: Secondary | ICD-10-CM

## 2020-08-28 DIAGNOSIS — J069 Acute upper respiratory infection, unspecified: Secondary | ICD-10-CM

## 2020-08-28 DIAGNOSIS — J029 Acute pharyngitis, unspecified: Secondary | ICD-10-CM

## 2020-08-28 DIAGNOSIS — Z20822 Contact with and (suspected) exposure to covid-19: Secondary | ICD-10-CM | POA: Diagnosis not present

## 2020-08-28 LAB — POCT RAPID STREP A (OFFICE): Rapid Strep A Screen: NEGATIVE

## 2020-08-28 MED ORDER — BENZONATATE 100 MG PO CAPS: 100.0000 mg | ORAL_CAPSULE | Freq: Three times a day (TID) | ORAL | 0 refills | Status: DC

## 2020-08-28 MED ORDER — FLUTICASONE PROPIONATE 50 MCG/ACT NA SUSP: 1.0000 | Freq: Every day | NASAL | 0 refills | Status: DC

## 2020-08-28 MED ORDER — CETIRIZINE HCL 10 MG PO TABS: 10.0000 mg | ORAL_TABLET | Freq: Every day | ORAL | 0 refills | Status: DC

## 2020-08-28 NOTE — Discharge Instructions (Signed)

## 2020-08-28 NOTE — ED Triage Notes (Signed)
Pt c/o fever, sore throat, cough, headache, and congestion since Friday. States has had strep and covid exposure.

## 2020-08-28 NOTE — ED Provider Notes (Signed)
[facility]    CSN: [id number] Arrival date & time: 08/28/20  0843      History   Chief Complaint Chief Complaint  Patient presents with  . Sore Throat    HPI Cynthia Tapia is a 54 y.o. female  History was provided by the patient. Cynthia Tapia is a 54 y.o. female who presents for evaluation of a sore throat. Associated symptoms include dry cough, headache, nasal blockage, post nasal drip, sinus and nasal congestion and sore throat. Onset of symptoms was 4 days ago, gradually worsening since that time.  She is drinking plenty of fluids. She has had recent close exposure to someone with proven streptococcal pharyngitis as well as covid. The following portions of the patient's history were reviewed and updated as appropriate: allergies, current medications, past family history, past medical history, past social history, past surgical history and problem list.     Past Medical History:  Diagnosis Date  . Anxiety   . GERD (gastroesophageal reflux disease)   . History of hiatal hernia   . Hyperlipidemia   . Hypertension   . Migraine     There are no problems to display for this patient.   Past Surgical History:  Procedure Laterality Date  . CESAREAN SECTION     x 2  . CHOLECYSTECTOMY N/A 06/23/2018   Procedure: LAPAROSCOPIC CHOLECYSTECTOMY;  Surgeon: Erroll Luna, MD;  Location: [facility];  Service: General;  Laterality: N/A;  . ESOPHAGOGASTRODUODENOSCOPY    . LAPAROSCOPIC HYSTERECTOMY      OB History   No obstetric history on file.      Home Medications    Prior to Admission medications   Medication Sig Start Date End Date Taking? Authorizing Provider  amLODipine (NORVASC) 5 MG tablet Take 5 mg by mouth daily.    [provider]  benzonatate (TESSALON) 100 MG capsule Take 1 capsule (100 mg total) by mouth every 8 (eight) hours. 08/28/20   Hall-Potvin, [place], PA-C  cetirizine (ZYRTEC ALLERGY) 10 MG tablet Take 1 tablet (10 mg total) by  mouth daily. 08/28/20   Hall-Potvin, [place], PA-C  cholecalciferol (VITAMIN D3) 25 MCG (1000 UNIT) tablet Take 1,000 Units by mouth daily.    [provider]  fluticasone (FLONASE) 50 MCG/ACT nasal spray Place 1 spray into both nostrils daily. 08/28/20   Hall-Potvin, [place], PA-C  Fremanezumab-vfrm (AJOVY [place]) Inject into the skin.    [provider]  iron polysaccharides (NIFEREX) 150 MG capsule Take 150 mg by mouth daily.    [provider]  losartan-hydrochlorothiazide (HYZAAR) 100-25 MG tablet Take 1 tablet by mouth daily. 06/05/18   [provider]  pravastatin (PRAVACHOL) 20 MG tablet Take 20 mg by mouth daily. Takes in the evening    [provider]  rizatriptan (MAXALT) 10 MG tablet Take 10 mg by mouth as needed for migraine. May repeat in 2 hours if needed    [provider]  SUMAtriptan (IMITREX) 100 MG tablet Take 100 mg by mouth every 2 (two) hours as needed.  04/06/15 06/06/26  [provider]  tiZANidine (ZANAFLEX) 4 MG capsule Take 4 mg by mouth every 8 (eight) hours as needed (Miagraine).     [provider]  zonisamide (ZONEGRAN) 100 MG capsule Take 100 mg by mouth daily.    [provider]    Family History Family History  Problem Relation Age of Onset  . Migraines Mother     Social History Social History   Tobacco  Use  . Smoking status: Never Smoker  . Smokeless tobacco: Never Used  Vaping Use  . Vaping Use: Never used  Substance Use Topics  . Alcohol use: Yes    Comment: occ  . Drug use: No     Allergies   Latex   Review of Systems Review of Systems  Constitutional: Positive for fever. Negative for fatigue.  HENT: Positive for congestion, postnasal drip and sore throat. Negative for dental problem, ear pain, facial swelling, hearing loss, sinus pain, trouble swallowing and voice change.   Eyes: Negative for photophobia, pain and visual disturbance.  Respiratory: Positive for  cough. Negative for shortness of breath.   Cardiovascular: Negative for chest pain and palpitations.  Gastrointestinal: Negative for diarrhea and vomiting.  Musculoskeletal: Negative for arthralgias and myalgias.  Neurological: Positive for headaches. Negative for dizziness.     Physical Exam Triage Vital Signs ED Triage Vitals  Enc Vitals Group     BP 08/28/20 0923 (!) 162/114     Pulse Rate 08/28/20 0923 77     Resp 08/28/20 0923 20     Temp 08/28/20 0923 98.2 F (36.8 C)     Temp Source 08/28/20 0923 Oral     SpO2 08/28/20 0923 98 %     Weight --      Height --      Head Circumference --      Peak Flow --      Pain Score 08/28/20 0927 3     Pain Loc --      Pain Edu? --      Excl. in [place]? --    No data found.  Updated Vital Signs BP (!) 162/114 (BP Location: Left Arm)   Pulse 77   Temp 98.2 F (36.8 C) (Oral)   Resp 20   SpO2 98%   Visual Acuity Right Eye Distance:   Left Eye Distance:   Bilateral Distance:    Right Eye Near:   Left Eye Near:    Bilateral Near:     Physical Exam Constitutional:      General: She is not in acute distress.    Appearance: She is not ill-appearing or diaphoretic.  HENT:     Head: Normocephalic and atraumatic.     Right Ear: Tympanic membrane and ear canal normal.     Left Ear: Tympanic membrane and ear canal normal.     Mouth/Throat:     Mouth: Mucous membranes are moist.     Pharynx: Oropharynx is clear. No oropharyngeal exudate or posterior oropharyngeal erythema.  Eyes:     General: No scleral icterus.    Conjunctiva/sclera: Conjunctivae normal.     Pupils: Pupils are equal, round, and reactive to light.  Neck:     Comments: Trachea midline, negative JVD Cardiovascular:     Rate and Rhythm: Normal rate and regular rhythm.     Heart sounds: No murmur heard. No gallop.   Pulmonary:     Effort: Pulmonary effort is normal. No respiratory distress.     Breath sounds: No wheezing, rhonchi or rales.  Musculoskeletal:      Cervical back: Neck supple. No tenderness.  Lymphadenopathy:     Cervical: No cervical adenopathy.  Skin:    Capillary Refill: Capillary refill takes less than 2 seconds.     Coloration: Skin is not jaundiced or pale.     Findings: No rash.  Neurological:     General: No focal deficit present.     Mental  Status: She is alert and oriented to person, place, and time.      UC Treatments / Results  Labs (all labs ordered are listed, but only abnormal results are displayed) Labs Reviewed  NOVEL CORONAVIRUS, NAA  CULTURE, GROUP A STREP [facility])  POCT RAPID STREP A (OFFICE)    EKG   Radiology No results found.  Procedures Procedures (including critical care time)  Medications Ordered in UC Medications - No data to display  Initial Impression / Assessment and Plan / UC Course  I have reviewed the triage vital signs and the nursing notes.  Pertinent labs & imaging results that were available during my care of the patient were reviewed by me and considered in my medical decision making (see chart for details).     Patient afebrile, nontoxic, with SpO2 98%.  Strep negative, culture pending.  Covid PCR pending.  Patient to quarantine until results are back.  We will treat supportively as outlined below, avoid decongestants given hypertension in office.  Return precautions discussed, patient verbalized understanding and is agreeable to plan. Final Clinical Impressions(s) / UC Diagnoses   Final diagnoses:  Encounter for screening laboratory testing for COVID-19 virus  URI with cough and congestion  Sore throat     Discharge Instructions     Tessalon for cough. Start flonase, atrovent nasal spray for nasal congestion/drainage. You can use over the counter nasal saline rinse such as neti pot for nasal congestion. Keep hydrated, your urine should be clear to pale yellow in color. Tylenol/motrin for fever and pain. Monitor for any worsening of symptoms, chest pain, shortness of  breath, wheezing, swelling of the throat, go to the emergency department for further evaluation needed.     ED Prescriptions    Medication Sig Dispense Auth. Provider   cetirizine (ZYRTEC ALLERGY) 10 MG tablet Take 1 tablet (10 mg total) by mouth daily. 30 tablet Hall-Potvin, [place], PA-C   fluticasone (FLONASE) 50 MCG/ACT nasal spray Place 1 spray into both nostrils daily. 16 g Hall-Potvin, [place], PA-C   benzonatate (TESSALON) 100 MG capsule Take 1 capsule (100 mg total) by mouth every 8 (eight) hours. 21 capsule Hall-Potvin, [place], PA-C     PDMP not reviewed this encounter.   Hall-Potvin, [place], [place] 08/28/20 1019

## 2020-08-30 LAB — NOVEL CORONAVIRUS, NAA: SARS-CoV-2, NAA: NOT DETECTED

## 2020-08-30 LAB — SARS-COV-2, NAA 2 DAY TAT

## 2020-08-30 LAB — CULTURE, GROUP A STREP (THRC)

## 2022-01-10 ENCOUNTER — Inpatient Hospital Stay (HOSPITAL_COMMUNITY): Payer: BC Managed Care – PPO | Admitting: Certified Registered"

## 2022-01-10 ENCOUNTER — Other Ambulatory Visit: Payer: Self-pay

## 2022-01-10 ENCOUNTER — Inpatient Hospital Stay (HOSPITAL_COMMUNITY)
Admission: EM | Admit: 2022-01-10 | Discharge: 2022-01-13 | DRG: 917 | Disposition: A | Discharge status: Other Acute Inpatient Hospital | Payer: BC Managed Care – PPO | Attending: Family Medicine | Admitting: Family Medicine

## 2022-01-10 ENCOUNTER — Encounter (HOSPITAL_COMMUNITY): Payer: Self-pay | Admitting: Emergency Medicine

## 2022-01-10 ENCOUNTER — Emergency Department (HOSPITAL_COMMUNITY): Payer: BC Managed Care – PPO

## 2022-01-10 ENCOUNTER — Encounter (HOSPITAL_COMMUNITY)
Admission: EM | Disposition: A | Discharge status: Other Acute Inpatient Hospital | Payer: Self-pay | Source: Home / Self Care | Attending: Family Medicine

## 2022-01-10 DIAGNOSIS — K2101 Gastro-esophageal reflux disease with esophagitis, with bleeding: Secondary | ICD-10-CM | POA: Diagnosis present

## 2022-01-10 DIAGNOSIS — Z79899 Other long term (current) drug therapy: Secondary | ICD-10-CM | POA: Diagnosis not present

## 2022-01-10 DIAGNOSIS — T50901D Poisoning by unspecified drugs, medicaments and biological substances, accidental (unintentional), subsequent encounter: Secondary | ICD-10-CM | POA: Diagnosis not present

## 2022-01-10 DIAGNOSIS — F419 Anxiety disorder, unspecified: Secondary | ICD-10-CM

## 2022-01-10 DIAGNOSIS — N390 Urinary tract infection, site not specified: Secondary | ICD-10-CM | POA: Diagnosis present

## 2022-01-10 DIAGNOSIS — K2971 Gastritis, unspecified, with bleeding: Secondary | ICD-10-CM | POA: Diagnosis present

## 2022-01-10 DIAGNOSIS — T50901A Poisoning by unspecified drugs, medicaments and biological substances, accidental (unintentional), initial encounter: Secondary | ICD-10-CM

## 2022-01-10 DIAGNOSIS — E860 Dehydration: Secondary | ICD-10-CM | POA: Diagnosis present

## 2022-01-10 DIAGNOSIS — T398X2A Poisoning by other nonopioid analgesics and antipyretics, not elsewhere classified, intentional self-harm, initial encounter: Secondary | ICD-10-CM | POA: Diagnosis present

## 2022-01-10 DIAGNOSIS — E785 Hyperlipidemia, unspecified: Secondary | ICD-10-CM | POA: Diagnosis present

## 2022-01-10 DIAGNOSIS — K920 Hematemesis: Secondary | ICD-10-CM | POA: Diagnosis not present

## 2022-01-10 DIAGNOSIS — N3000 Acute cystitis without hematuria: Secondary | ICD-10-CM | POA: Diagnosis not present

## 2022-01-10 DIAGNOSIS — T50912A Poisoning by multiple unspecified drugs, medicaments and biological substances, intentional self-harm, initial encounter: Secondary | ICD-10-CM | POA: Diagnosis not present

## 2022-01-10 DIAGNOSIS — G928 Other toxic encephalopathy: Secondary | ICD-10-CM | POA: Diagnosis present

## 2022-01-10 DIAGNOSIS — T43212A Poisoning by selective serotonin and norepinephrine reuptake inhibitors, intentional self-harm, initial encounter: Secondary | ICD-10-CM | POA: Diagnosis present

## 2022-01-10 DIAGNOSIS — K209 Esophagitis, unspecified without bleeding: Secondary | ICD-10-CM

## 2022-01-10 DIAGNOSIS — F32A Depression, unspecified: Secondary | ICD-10-CM | POA: Diagnosis present

## 2022-01-10 DIAGNOSIS — T50902A Poisoning by unspecified drugs, medicaments and biological substances, intentional self-harm, initial encounter: Secondary | ICD-10-CM | POA: Diagnosis not present

## 2022-01-10 DIAGNOSIS — T424X2A Poisoning by benzodiazepines, intentional self-harm, initial encounter: Secondary | ICD-10-CM | POA: Diagnosis present

## 2022-01-10 DIAGNOSIS — K922 Gastrointestinal hemorrhage, unspecified: Secondary | ICD-10-CM

## 2022-01-10 DIAGNOSIS — N179 Acute kidney failure, unspecified: Secondary | ICD-10-CM | POA: Diagnosis present

## 2022-01-10 DIAGNOSIS — G929 Unspecified toxic encephalopathy: Secondary | ICD-10-CM | POA: Diagnosis not present

## 2022-01-10 DIAGNOSIS — I10 Essential (primary) hypertension: Secondary | ICD-10-CM | POA: Diagnosis present

## 2022-01-10 DIAGNOSIS — Z20822 Contact with and (suspected) exposure to covid-19: Secondary | ICD-10-CM | POA: Diagnosis present

## 2022-01-10 DIAGNOSIS — F449 Dissociative and conversion disorder, unspecified: Secondary | ICD-10-CM | POA: Diagnosis present

## 2022-01-10 DIAGNOSIS — T50912D Poisoning by multiple unspecified drugs, medicaments and biological substances, intentional self-harm, subsequent encounter: Secondary | ICD-10-CM | POA: Diagnosis not present

## 2022-01-10 DIAGNOSIS — Z90710 Acquired absence of both cervix and uterus: Secondary | ICD-10-CM | POA: Diagnosis not present

## 2022-01-10 DIAGNOSIS — R4182 Altered mental status, unspecified: Secondary | ICD-10-CM | POA: Diagnosis present

## 2022-01-10 DIAGNOSIS — D649 Anemia, unspecified: Secondary | ICD-10-CM | POA: Diagnosis present

## 2022-01-10 DIAGNOSIS — F4310 Post-traumatic stress disorder, unspecified: Secondary | ICD-10-CM | POA: Diagnosis present

## 2022-01-10 DIAGNOSIS — K259 Gastric ulcer, unspecified as acute or chronic, without hemorrhage or perforation: Secondary | ICD-10-CM | POA: Diagnosis present

## 2022-01-10 DIAGNOSIS — K253 Acute gastric ulcer without hemorrhage or perforation: Secondary | ICD-10-CM | POA: Diagnosis not present

## 2022-01-10 HISTORY — PX: BIOPSY: SHX5522

## 2022-01-10 HISTORY — PX: ESOPHAGOGASTRODUODENOSCOPY: SHX5428

## 2022-01-10 LAB — CBC WITH DIFFERENTIAL/PLATELET
Abs Immature Granulocytes: 0.03 10*3/uL (ref 0.00–0.07)
Basophils Absolute: 0 10*3/uL (ref 0.0–0.1)
Basophils Relative: 1 %
Eosinophils Absolute: 0.2 10*3/uL (ref 0.0–0.5)
Eosinophils Relative: 3 %
HCT: 39.2 % (ref 36.0–46.0)
Hemoglobin: 12.8 g/dL (ref 12.0–15.0)
Immature Granulocytes: 0 %
Lymphocytes Relative: 31 %
Lymphs Abs: 2.2 10*3/uL (ref 0.7–4.0)
MCH: 28.3 pg (ref 26.0–34.0)
MCHC: 32.7 g/dL (ref 30.0–36.0)
MCV: 86.7 fL (ref 80.0–100.0)
Monocytes Absolute: 0.6 10*3/uL (ref 0.1–1.0)
Monocytes Relative: 9 %
Neutro Abs: 3.8 10*3/uL (ref 1.7–7.7)
Neutrophils Relative %: 56 %
Platelets: 296 10*3/uL (ref 150–400)
RBC: 4.52 MIL/uL (ref 3.87–5.11)
RDW: 13.5 % (ref 11.5–15.5)
WBC: 6.9 10*3/uL (ref 4.0–10.5)
nRBC: 0 % (ref 0.0–0.2)

## 2022-01-10 LAB — HIV ANTIBODY (ROUTINE TESTING W REFLEX): HIV Screen 4th Generation wRfx: NONREACTIVE

## 2022-01-10 LAB — COMPREHENSIVE METABOLIC PANEL
ALT: 16 U/L (ref 0–44)
ALT: 14 U/L (ref 0–44)
AST: 19 U/L (ref 15–41)
AST: 14 U/L — ABNORMAL LOW (ref 15–41)
Albumin: 3.8 g/dL (ref 3.5–5.0)
Albumin: 3.4 g/dL — ABNORMAL LOW (ref 3.5–5.0)
Alkaline Phosphatase: 81 U/L (ref 38–126)
Alkaline Phosphatase: 70 U/L (ref 38–126)
Anion gap: 7 (ref 5–15)
Anion gap: 6 (ref 5–15)
BUN: 20 mg/dL (ref 6–20)
BUN: 14 mg/dL (ref 6–20)
CO2: 25 mmol/L (ref 22–32)
CO2: 25 mmol/L (ref 22–32)
Calcium: 9.2 mg/dL (ref 8.9–10.3)
Calcium: 8.2 mg/dL — ABNORMAL LOW (ref 8.9–10.3)
Chloride: 107 mmol/L (ref 98–111)
Chloride: 111 mmol/L (ref 98–111)
Creatinine, Ser: 1.15 mg/dL — ABNORMAL HIGH (ref 0.44–1.00)
Creatinine, Ser: 0.87 mg/dL (ref 0.44–1.00)
GFR, Estimated: 57 mL/min — ABNORMAL LOW (ref >60)
GFR, Estimated: >60 mL/min (ref >60)
Glucose, Bld: 108 mg/dL — ABNORMAL HIGH (ref 70–99)
Glucose, Bld: 98 mg/dL (ref 70–99)
Potassium: 3.6 mmol/L (ref 3.5–5.1)
Potassium: 3.4 mmol/L — ABNORMAL LOW (ref 3.5–5.1)
Sodium: 139 mmol/L (ref 135–145)
Sodium: 142 mmol/L (ref 135–145)
Total Bilirubin: 0.6 mg/dL (ref 0.3–1.2)
Total Bilirubin: 0.4 mg/dL (ref 0.3–1.2)
Total Protein: 6.9 g/dL (ref 6.5–8.1)
Total Protein: 6.2 g/dL — ABNORMAL LOW (ref 6.5–8.1)

## 2022-01-10 LAB — TYPE AND SCREEN
ABO/RH(D): A NEG
Antibody Screen: NEGATIVE

## 2022-01-10 LAB — BLOOD GAS, VENOUS
Acid-base deficit: 1.5 mmol/L (ref 0.0–2.0)
Bicarbonate: 24.3 mmol/L (ref 20.0–28.0)
Drawn by: 60091
O2 Saturation: 90.1 %
Patient temperature: 37.7
pCO2, Ven: 45 mmHg (ref 44–60)
pH, Ven: 7.34 (ref 7.25–7.43)
pO2, Ven: 59 mmHg — ABNORMAL HIGH (ref 32–45)

## 2022-01-10 LAB — RAPID URINE DRUG SCREEN, HOSP PERFORMED
Amphetamines: NOT DETECTED
Barbiturates: NOT DETECTED
Benzodiazepines: POSITIVE — AB
Cocaine: NOT DETECTED
Opiates: NOT DETECTED
Tetrahydrocannabinol: NOT DETECTED

## 2022-01-10 LAB — MRSA NEXT GEN BY PCR, NASAL: MRSA by PCR Next Gen: NOT DETECTED

## 2022-01-10 LAB — I-STAT BETA HCG BLOOD, ED (MC, WL, AP ONLY): I-stat hCG, quantitative: 6.5 m[IU]/mL — ABNORMAL HIGH (ref <5)

## 2022-01-10 LAB — POC OCCULT BLOOD, ED: Fecal Occult Bld: POSITIVE — AB

## 2022-01-10 LAB — ACETAMINOPHEN LEVEL: Acetaminophen (Tylenol), Serum: <10 ug/mL — ABNORMAL LOW (ref 10–30)

## 2022-01-10 LAB — RESP PANEL BY RT-PCR (FLU A&B, COVID) ARPGX2
Influenza A by PCR: NEGATIVE
Influenza B by PCR: NEGATIVE
SARS Coronavirus 2 by RT PCR: NEGATIVE

## 2022-01-10 LAB — SALICYLATE LEVEL: Salicylate Lvl: <7 mg/dL — ABNORMAL LOW (ref 7.0–30.0)

## 2022-01-10 LAB — CBG MONITORING, ED: Glucose-Capillary: 162 mg/dL — ABNORMAL HIGH (ref 70–99)

## 2022-01-10 LAB — ETHANOL: Alcohol, Ethyl (B): <10 mg/dL (ref <10)

## 2022-01-10 SURGERY — EGD (ESOPHAGOGASTRODUODENOSCOPY)
Anesthesia: General

## 2022-01-10 MED ORDER — PANTOPRAZOLE 80MG IVPB - SIMPLE MED
80.0000 mg | Freq: Once | INTRAVENOUS | Status: AC
  Administered 2022-01-10: 80 mg via INTRAVENOUS
  Filled 2022-01-10: qty 80

## 2022-01-10 MED ORDER — ORAL CARE MOUTH RINSE
15.0000 mL | Freq: Two times a day (BID) | OROMUCOSAL | Status: DC
  Administered 2022-01-10 – 2022-01-13 (×6): 15 mL via OROMUCOSAL

## 2022-01-10 MED ORDER — SODIUM CHLORIDE 0.9 % IV SOLN: INTRAVENOUS | Status: DC

## 2022-01-10 MED ORDER — HALOPERIDOL LACTATE 5 MG/ML IJ SOLN
5.0000 mg | Freq: Four times a day (QID) | INTRAMUSCULAR | Status: DC | PRN
  Administered 2022-01-10 – 2022-01-13 (×4): 5 mg via INTRAVENOUS
  Filled 2022-01-10 (×4): qty 1

## 2022-01-10 MED ORDER — ONDANSETRON HCL 4 MG/2ML IJ SOLN
INTRAMUSCULAR | Status: DC | PRN
Start: 2022-01-10 — End: 2022-01-10
  Administered 2022-01-10: 4 mg via INTRAVENOUS

## 2022-01-10 MED ORDER — LACTATED RINGERS IV SOLN: INTRAVENOUS | Status: DC | PRN

## 2022-01-10 MED ORDER — PANTOPRAZOLE SODIUM 40 MG IV SOLR
40.0000 mg | Freq: Two times a day (BID) | INTRAVENOUS | Status: DC
  Administered 2022-01-13: 40 mg via INTRAVENOUS
  Filled 2022-01-10: qty 10

## 2022-01-10 MED ORDER — SUCCINYLCHOLINE CHLORIDE 200 MG/10ML IV SOSY
PREFILLED_SYRINGE | INTRAVENOUS | Status: DC | PRN
  Administered 2022-01-10: 120 mg via INTRAVENOUS

## 2022-01-10 MED ORDER — SODIUM CHLORIDE 0.9 % IV BOLUS
1000.0000 mL | Freq: Once | INTRAVENOUS | Status: AC
  Administered 2022-01-10: 1000 mL via INTRAVENOUS

## 2022-01-10 MED ORDER — DOCUSATE SODIUM 50 MG/5ML PO LIQD
100.0000 mg | Freq: Two times a day (BID) | ORAL | Status: DC | PRN
  Filled 2022-01-10: qty 10

## 2022-01-10 MED ORDER — PANTOPRAZOLE INFUSION (NEW) - SIMPLE MED
8.0000 mg/h | INTRAVENOUS | Status: DC
Start: 2022-01-10 — End: 2022-01-10
  Administered 2022-01-10 (×2): 8 mg/h via INTRAVENOUS
  Filled 2022-01-10: qty 100
  Filled 2022-01-10 (×2): qty 80

## 2022-01-10 MED ORDER — ACETAMINOPHEN 325 MG PO TABS
650.0000 mg | ORAL_TABLET | ORAL | Status: DC | PRN
  Administered 2022-01-11 (×2): 650 mg
  Filled 2022-01-10 (×2): qty 2

## 2022-01-10 MED ORDER — SODIUM BICARBONATE 8.4 % IV SOLN
INTRAVENOUS | Status: DC
  Filled 2022-01-10: qty 150
  Filled 2022-01-10: qty 1000
  Filled 2022-01-10 (×2): qty 150
  Filled 2022-01-10: qty 1000

## 2022-01-10 MED ORDER — POLYETHYLENE GLYCOL 3350 17 G PO PACK: 17.0000 g | PACK | Freq: Every day | ORAL | Status: DC | PRN

## 2022-01-10 MED ORDER — PANTOPRAZOLE SODIUM 40 MG IV SOLR: 40.0000 mg | Freq: Two times a day (BID) | INTRAVENOUS | Status: DC

## 2022-01-10 MED ORDER — FENTANYL CITRATE (PF) 100 MCG/2ML IJ SOLN
INTRAMUSCULAR | Status: AC
  Filled 2022-01-10: qty 2

## 2022-01-10 MED ORDER — DEXAMETHASONE SODIUM PHOSPHATE 10 MG/ML IJ SOLN
INTRAMUSCULAR | Status: DC | PRN
  Administered 2022-01-10: 5 mg via INTRAVENOUS

## 2022-01-10 MED ORDER — PROPOFOL 10 MG/ML IV BOLUS
INTRAVENOUS | Status: DC | PRN
  Administered 2022-01-10: 60 mg via INTRAVENOUS
  Administered 2022-01-10: 140 mg via INTRAVENOUS

## 2022-01-10 MED ORDER — CHARCOAL ACTIVATED PO LIQD
50.0000 g | Freq: Once | ORAL | Status: AC
  Administered 2022-01-10: 50 g via ORAL
  Filled 2022-01-10: qty 240

## 2022-01-10 MED ORDER — CHLORHEXIDINE GLUCONATE CLOTH 2 % EX PADS
6.0000 | MEDICATED_PAD | Freq: Every day | CUTANEOUS | Status: DC
  Administered 2022-01-10 – 2022-01-11 (×2): 6 via TOPICAL

## 2022-01-10 MED ORDER — ONDANSETRON HCL 4 MG/2ML IJ SOLN
4.0000 mg | Freq: Four times a day (QID) | INTRAMUSCULAR | Status: DC | PRN
  Administered 2022-01-10: 4 mg via INTRAVENOUS
  Filled 2022-01-10: qty 2

## 2022-01-10 MED ORDER — LIDOCAINE 2% (20 MG/ML) 5 ML SYRINGE
INTRAMUSCULAR | Status: DC | PRN
  Administered 2022-01-10: 100 mg via INTRAVENOUS

## 2022-01-10 MED ORDER — POTASSIUM CHLORIDE 10 MEQ/100ML IV SOLN
10.0000 meq | INTRAVENOUS | Status: AC
  Administered 2022-01-10 (×3): 10 meq via INTRAVENOUS
  Filled 2022-01-10 (×3): qty 100

## 2022-01-10 MED ORDER — NALOXONE HCL 0.4 MG/ML IJ SOLN
0.4000 mg | Freq: Once | INTRAMUSCULAR | Status: AC
  Administered 2022-01-10: 0.4 mg via INTRAVENOUS
  Filled 2022-01-10: qty 1

## 2022-01-10 MED ORDER — LORAZEPAM 2 MG/ML IJ SOLN
2.0000 mg | Freq: Once | INTRAMUSCULAR | Status: AC
  Administered 2022-01-10: 2 mg via INTRAVENOUS
  Filled 2022-01-10: qty 1

## 2022-01-10 NOTE — Anesthesia Preprocedure Evaluation (Addendum)
Anesthesia Evaluation  ?Patient identified by MRN, date of birth, ID band ?Patient awake ? ? ? ?Reviewed: ?Allergy & Precautions, NPO status , Patient's Chart, lab work & pertinent test results ? ?Airway ?Mallampati: III ? ?TM Distance: >3 FB ?Neck ROM: Full ? ? ? Dental ? ?(+) Dental Advisory Given, Teeth Intact ?  ?Pulmonary ? ?  ?Pulmonary exam normal ?breath sounds clear to auscultation ? ? ? ? ? ? Cardiovascular ?hypertension, Pt. on medications ?Normal cardiovascular exam ?Rhythm:Regular Rate:Normal ? ? ?  ?Neuro/Psych ? Headaches, Anxiety   ? GI/Hepatic ?Neg liver ROS, hiatal hernia, GERD  Medicated,  ?Endo/Other  ?negative endocrine ROS ? Renal/GU ?negative Renal ROS  ? ?  ?Musculoskeletal ?negative musculoskeletal ROS ?(+)  ? Abdominal ?(+) + obese,   ?Peds ? Hematology ?negative hematology ROS ?(+)   ?Anesthesia Other Findings ?- HLD ? Reproductive/Obstetrics ? ?  ? ? ? ? ? ? ? ? ? ? ? ? ? ?  ?  ? ? ? ? ? ? ? ?Lab Results  ?Component Value Date  ? WBC 6.9 01/10/2022  ? HGB 12.8 01/10/2022  ? HCT 39.2 01/10/2022  ? MCV 86.7 01/10/2022  ? PLT 296 01/10/2022  ? ?Lab Results  ?Component Value Date  ? CREATININE 0.87 01/10/2022  ? BUN 14 01/10/2022  ? NA 142 01/10/2022  ? K 3.4 (L) 01/10/2022  ? CL 111 01/10/2022  ? CO2 25 01/10/2022  ? ?Lab Results  ?Component Value Date  ? INR 0.94 12/18/2014  ? ?EKG: normal sinus rhythm. ? ?Anesthesia Physical ? ?Anesthesia Plan ? ?ASA: 3 ? ?Anesthesia Plan: General  ? ?Post-op Pain Management: Minimal or no pain anticipated  ? ?Induction: Intravenous, Rapid sequence and Cricoid pressure planned ? ?PONV Risk Score and Plan: 4 or greater and Midazolam, Ondansetron, Dexamethasone and Treatment may vary due to age or medical condition ? ?Airway Management Planned: Oral ETT ? ?Additional Equipment: None ? ?Intra-op Plan:  ? ?Post-operative Plan: Extubation in OR ? ?Informed Consent: I have reviewed the patients History and Physical, chart, labs  and discussed the procedure including the risks, benefits and alternatives for the proposed anesthesia with the patient or authorized representative who has indicated his/her understanding and acceptance.  ? ? ? ?Dental advisory given ? ?Plan Discussed with: CRNA ? ?Anesthesia Plan Comments:   ? ? ? ? ? ?Anesthesia Quick Evaluation ? ?

## 2022-01-10 NOTE — Brief Op Note (Signed)
01/10/2022 ? ?2:35 PM ? ?PATIENT:  Cynthia Tapia  56 y.o. female ? ?PRE-OPERATIVE DIAGNOSIS:  coffee ground emesis ? ?POST-OPERATIVE DIAGNOSIS:  * No post-op diagnosis entered * ? ?PROCEDURE:  Procedure(s): ?ESOPHAGOGASTRODUODENOSCOPY (EGD) (N/A) ? ?SURGEON:  Surgeon(s) and Role: ?   * Otis Brace, MD - Primary ? ?Findings ?------------- ?-EGD showed LA grade B esophagitis and few small nonbleeding gastric ulcers.  Biopsies taken.  NG tube was removed. ? ?Recommendations ?-------------------------- ?-Soft diet and advance as tolerated ?-Protonix IV 40 mg twice daily while in hospital.  Switch to Protonix p.o. 40 mg twice a day for 8 weeks followed by Protonix 40 mg once a day for another 4 weeks. ?-Recommend repeat EGD in 2 to 3 months to document healing of esophagitis and gastric ulcers. ?-Further inpatient GI work-up planned.  GI will sign off.  Call us back if needed.  Follow-up in GI clinic in 2 months after discharge. ? ?Otis Brace MD, FACP ?01/10/2022, 2:42 PM ? ?Contact #  [phone]  ?

## 2022-01-10 NOTE — ED Notes (Signed)
Pt belongings (clothing) placed in a bag labeled with pt labels. Bag placed in cabinet at 16-18 nurses station. ?

## 2022-01-10 NOTE — Consult Note (Signed)
Referring Provider: TRH ?Primary Care Physician:  Antony Contras, MD ?Primary Gastroenterologist:  [facility] ? ?Reason for Consultation:  coffee ground emesis ? ?HPI: Cynthia Tapia is a 56 y.o. female pmh significant for dissociative disorder, anxiety, depression, ptsd, hyperlipidemia, htn, Barrett's esophagus,migraines who presented after intentional overdose of medications at home as she wanted to end her life. ? ?She is accompanied by her husband and best friend.  Patient is asleep and not easily aroused. She reportedly took approximately '60mg'$  klonopin, 80 mg toradol and '24mg'$  tizanidine.  ?When pt arrived she was altered but arousable. When Ng tube was placed  >1L of bloody gastric contents were removed.  ? ?Her husband reports that she has had black vomit and stool for some time now the last 3-4 weeks. He is unsure how often she has episodes of emesis but her husband has been aware of 2-3 episodes of coffee-ground emesis in the past few weeks.  He notes that her reflux had been worsening significantly.  She has also gained weight recently due to her medications which is also contributed to worsening reflux.  He notes that when her reflux is bad she will vomit.  She is not currently taking any medication for her reflux at home. ? ?Husband mentions occasional dark stools.   ?She has a history of upper endoscopy for gastritis but EGD was normal aside from Barrett's esophagus.  ? ?Denies NSAID use, alcohol, IV drug, smoking.  ?Denies family history of colon cancer ?Denies blood thinner use prior to admission ? ?Colonoscopy 05/07/2020 ?One tubular adenoma in the transveres colon ?Otherwise normal exam ?Recall 7 years ? ?EGD 06/16/2018 ?4 cm hiatal hernia ?Short segment Barrett's esophagus without dysplasia ?Normal stomach and duodenum ?Recall 5 years ? ? ?Past Medical History:  ?Diagnosis Date  ? Anxiety   ? GERD (gastroesophageal reflux disease)   ? History of hiatal hernia   ? Hyperlipidemia   ? Hypertension   ?  Migraine   ? ? ?Past Surgical History:  ?Procedure Laterality Date  ? CESAREAN SECTION    ? x 2  ? CHOLECYSTECTOMY N/A 06/23/2018  ? Procedure: LAPAROSCOPIC CHOLECYSTECTOMY;  Surgeon: Erroll Luna, MD;  Location: [facility];  Service: General;  Laterality: N/A;  ? ESOPHAGOGASTRODUODENOSCOPY    ? LAPAROSCOPIC HYSTERECTOMY    ? ? ?Prior to Admission medications   ?Medication Sig Start Date End Date Taking? Authorizing Provider  ?amLODipine (NORVASC) 5 MG tablet Take 5 mg by mouth daily.    [provider]  ?benzonatate (TESSALON) 100 MG capsule Take 1 capsule (100 mg total) by mouth every 8 (eight) hours. 08/28/20   Hall-Potvin, [place], PA-C  ?cetirizine (ZYRTEC ALLERGY) 10 MG tablet Take 1 tablet (10 mg total) by mouth daily. 08/28/20   Hall-Potvin, [place], PA-C  ?cholecalciferol (VITAMIN D3) 25 MCG (1000 UNIT) tablet Take 1,000 Units by mouth daily.    [provider]  ?fluticasone (FLONASE) 50 MCG/ACT nasal spray Place 1 spray into both nostrils daily. 08/28/20   Hall-Potvin, [place], PA-C  ?Fremanezumab-vfrm (AJOVY [place]) Inject into the skin.    [provider]  ?iron polysaccharides (NIFEREX) 150 MG capsule Take 150 mg by mouth daily.    [provider]  ?losartan-hydrochlorothiazide (HYZAAR) 100-25 MG tablet Take 1 tablet by mouth daily. 06/05/18   [provider]  ?pravastatin (PRAVACHOL) 20 MG tablet Take 20 mg by mouth daily. Takes in the evening    [provider]  ?rizatriptan (MAXALT) 10 MG tablet Take 10 mg by mouth  as needed for migraine. May repeat in 2 hours if needed    [provider]  ?SUMAtriptan (IMITREX) 100 MG tablet Take 100 mg by mouth every 2 (two) hours as needed.  04/06/15 06/06/26  [provider]  ?tiZANidine (ZANAFLEX) 4 MG capsule Take 4 mg by mouth every 8 (eight) hours as needed (Miagraine).     [provider]  ?zonisamide (ZONEGRAN) 100 MG capsule Take 100 mg by mouth daily.    [provider]   ? ? ?Scheduled Meds: ? [START ON 01/13/2022] pantoprazole  40 mg Intravenous Q12H  ? ?Continuous Infusions: ? sodium chloride 125 mL/hr at 01/10/22 0204  ? pantoprazole 8 mg/hr (01/10/22 0232)  ? sodium bicarbonate 150 mEq in D5W infusion 100 mL/hr at 01/10/22 0459  ? ?PRN Meds:.acetaminophen, docusate, polyethylene glycol ? ?Allergies as of 01/10/2022 - Review Complete 01/10/2022  ?Allergen Reaction Noted  ? Latex Rash 08/08/2015  ? ? ?Family History  ?Problem Relation Age of Onset  ? Migraines Mother   ? ? ?Social History  ? ?Socioeconomic History  ? Marital status: Married  ?  Spouse name: Not on file  ? Number of children: Not on file  ? Years of education: Not on file  ? Highest education level: Not on file  ?Occupational History  ? Not on file  ?Tobacco Use  ? Smoking status: Never  ? Smokeless tobacco: Never  ?Vaping Use  ? Vaping Use: Never used  ?Substance and Sexual Activity  ? Alcohol use: Yes  ?  Comment: occ  ? Drug use: No  ? Sexual activity: Not on file  ?Other Topics Concern  ? Not on file  ?Social History Narrative  ? Not on file  ? ?Social Determinants of Health  ? ?Financial Resource Strain: Not on file  ?Food Insecurity: Not on file  ?Transportation Needs: Not on file  ?Physical Activity: Not on file  ?Stress: Not on file  ?Social Connections: Not on file  ?Intimate Partner Violence: Not on file  ? ? ?Review of Systems: Review of Systems  ?Constitutional:  Negative for chills, fever and weight loss.  ?HENT:  Negative for hearing loss and tinnitus.   ?Eyes:  Negative for blurred vision and double vision.  ?Respiratory:  Negative for cough and sputum production.   ?Cardiovascular:  Negative for chest pain and palpitations.  ?Gastrointestinal:  Positive for heartburn, melena, nausea and vomiting. Negative for abdominal pain, blood in stool, constipation and diarrhea.  ?Genitourinary:  Negative for dysuria and urgency.  ?Musculoskeletal:  Negative for myalgias and neck pain.  ?Skin:  Negative for  itching and rash.  ?Neurological:  Negative for tremors and sensory change.  ?Endo/Heme/Allergies:  Negative for environmental allergies. Does not bruise/bleed easily.  ?Psychiatric/Behavioral:  Positive for substance abuse and suicidal ideas.    ? ?Physical Exam:Physical Exam ?Constitutional:   ?   Appearance: Normal appearance. She is normal weight.  ?HENT:  ?   Head: Normocephalic and atraumatic.  ?   Right Ear: External ear normal.  ?   Left Ear: External ear normal.  ?   Nose: Nose normal.  ?   Mouth/Throat:  ?   Mouth: Mucous membranes are dry.  ?Cardiovascular:  ?   Rate and Rhythm: Normal rate.  ?   Pulses: Normal pulses.  ?   Heart sounds: Normal heart sounds.  ?Pulmonary:  ?   Effort: Pulmonary effort is normal.  ?   Breath sounds: Normal breath sounds.  ?Abdominal:  ?  General: Abdomen is flat. Bowel sounds are normal. There is no distension.  ?   Palpations: Abdomen is soft. There is no mass.  ?   Tenderness: There is no abdominal tenderness. There is no guarding or rebound.  ?   Hernia: No hernia is present.  ?Musculoskeletal:     ?   General: No swelling. Normal range of motion.  ?   Cervical back: Normal range of motion.  ?Skin: ?   General: Skin is warm.  ?Neurological:  ?   General: No focal deficit present.  ?  ?Vital signs: ?Vitals:  ? 01/10/22 0530 01/10/22 0630  ?BP: 114/63 120/80  ?Pulse: (!) 59 (!) 53  ?Resp: 10 14  ?Temp:  (!) 97.3 ?F (36.3 ?C)  ?SpO2: 100% 99%  ? ?  ? ? ?GI:  ?Lab Results: ?Recent Labs  ?  01/10/22 ?0142  ?WBC 6.9  ?HGB 12.8  ?HCT 39.2  ?PLT 296  ? ?BMET ?Recent Labs  ?  01/10/22 ?0142  ?NA 139  ?K 3.6  ?CL 107  ?CO2 25  ?GLUCOSE 108*  ?BUN 20  ?CREATININE 1.15*  ?CALCIUM 9.2  ? ?LFT ?Recent Labs  ?  01/10/22 ?0142  ?PROT 6.9  ?ALBUMIN 3.8  ?AST 19  ?ALT 16  ?ALKPHOS 81  ?BILITOT 0.6  ? ?PT/INR ?No results for input(s): LABPROT, INR in the last 72 hours. ? ? ?Studies/Results: ?DG Abdomen 1 View ? ?Result Date: 01/10/2022 ?CLINICAL DATA:  Overdose. EXAM: ABDOMEN - 1 VIEW;  PORTABLE CHEST - 1 VIEW COMPARISON:  07/30/2006. FINDINGS: The heart is normal in size and the mediastinal contour are within normal limits. Lung volumes are low. No consolidation, effusion, or pneumothorax. No acute

## 2022-01-10 NOTE — ED Notes (Signed)
Called to ICU to give report to nurse she was in another patient's room and stated to give her 15 minutes. Will pass on to morning shift ?

## 2022-01-10 NOTE — ED Triage Notes (Signed)
BIB EMS from home for drug overdose, husband stated she took '60mg'$  Klonopin, '80mg'$  Toradol, and '24mg'$  Tizanidine. EMS stated she was in and out of consciousness and hypotensive for them, given 593m fluid bolus with return of normal vitals ?

## 2022-01-10 NOTE — Anesthesia Procedure Notes (Signed)
Procedure Name: Intubation ?Date/Time: 01/10/2022 2:22 PM ?Performed by: Gwyndolyn Saxon, CRNA ?Pre-anesthesia Checklist: Patient identified, Emergency Drugs available, Suction available and Patient being monitored ?Patient Re-evaluated:Patient Re-evaluated prior to induction ?Oxygen Delivery Method: Circle system utilized ?Preoxygenation: Pre-oxygenation with 100% oxygen ?Induction Type: IV induction, Rapid sequence and Cricoid Pressure applied ?Laryngoscope Size: Sabra Heck and 2 ?Grade View: Grade II ?Tube type: Oral ?Tube size: 7.0 mm ?Number of attempts: 1 ?Airway Equipment and Method: Stylet ?Placement Confirmation: ETT inserted through vocal cords under direct vision, positive ETCO2 and breath sounds checked- equal and bilateral ?Secured at: 21 cm ?Tube secured with: Tape ?Dental Injury: Teeth and Oropharynx as per pre-operative assessment  ? ? ? ? ?

## 2022-01-10 NOTE — H&P (Signed)
? ?NAME:  Cynthia Tapia, MRN:  [id number], DOB:  [birth year], LOS: 0 ?ADMISSION DATE:  01/10/2022, CONSULTATION DATE:  01/10/22 ?REFERRING MD:  edp, CHIEF COMPLAINT:  intentional overdose  ? ?History of Present Illness:  ?56 yo female with pmh significant for dissociative disorder, anxiety, depression, ptsd, hyperlipidemia, htn, migraines who presented after intentional overdose of medications at home as she wanted to end her life. She was reportedly in normal state of health (with exception of recent uti that she just finished abx for) until this afternoon. Per family and friend report pt was at her baseline this morning, pleasant without issues. Later in the day she had a therapy session and she states it was "bad" and since then she has been "spiraling".  ? ?Pt's husband called her therapist numerous times over the course of the day for assistance with helping pt cope. Unfortunately, she continued to feel worse and wanted to just "knock out and never wake up". She reportedly took approximately '60mg'$  klonopin, 80 mg toradol and '24mg'$  tizanidine.  ? ?She does endorse having suicidal thoughts before and even as far as a plan but has never acted on it until this incident.  ? ?When pt arrived she was altered but arousable. Ng was placed for activated charcoal but when tube was placed >1L of bloody gastric contents were removed. She reports that she has had black vomit and stool for some time now. She has a history of upper endoscopy for gastritis but never found any ulcerations.  ? ? ?Pertinent  Medical History  ?As above ? ?Significant Hospital Events: ?Including procedures, antibiotic start and stop dates in addition to other pertinent events   ?Admitted to ICU for close monitoring, consult psych ? ?Interim History / Subjective:  ?As above ? ?Objective   ?Blood pressure 114/63, pulse (!) 59, resp. rate 10, height '5\' 8"'$  (1.727 m), weight 96.2 kg, SpO2 100 %. ?   ?   ?No intake or output data in the 24 hours ending 01/10/22  0616 ?Filed Weights  ? 01/10/22 0205  ?Weight: 96.2 kg  ? ? ?Examination: ?General: ncat, pt reclining comfortably in bed, speaks slowly with some slurring ?HENT: ncat, eomi, perrla, mm dry but pink ?Lungs: ctab ?Cardiovascular: rrr  ?Abdomen: soft, non tender, bs + ?Extremities: no c/c/e ?Neuro: awake and conversant with some slowed/slurred speech. No focal deficits ?GU: purwick in place ? ?Resolved Hospital Problem list   ? ? ?Assessment & Plan:  ?Intentional overdose: ?Suicide attempt:  ?Suicide ideation:  ?-1:1 ?-consult psych ?-monitor in ICU for rhythm abnormalities ?-repeat ekg in am.  ?-poision control recommends at least 10 hours icu monitoring on bicarb infusion.  ? ?Toxic encephalopathy ?-monitor ?-supportive care ?-s/p charcoal dosing.  ? ?Aki:  ?-monitor indices ?-monitor uop ?-anticipate this will improve with fluids ?-pt just completed abx course for uti as well ?-baseline Cr 1.15 ? ?Gib:  ?-noted contents from ng ?-[facility]  ?-hgb stable at this time.  ?-on protonix infusion in ed ?-will need to call [facility] in am but no emergent need for scope ? ?Dissociative disorder ?Anxiety ?Depression ?Ptsd:  ?-noted and appreciate psych recommendations ? ?Hypertension:  ?-holing anti htn meds from home at this time ?Hyperlipidemia:  ?-on statin at home.   ? ?Best Practice (right click and "Reselect all SmartList Selections" daily)  ? ?Diet/type: NPO ?DVT prophylaxis: SCD ?GI prophylaxis: PPI ?Lines: N/A ?Foley:  N/A ?Code Status:  full code ?Last date of multidisciplinary goals of care discussion [  with pt and pt's husband at bedside 4/28] ? ?Labs   ?CBC: ?Recent Labs  ?Lab 01/10/22 ?0142  ?WBC 6.9  ?NEUTROABS 3.8  ?HGB 12.8  ?HCT 39.2  ?MCV 86.7  ?PLT 296  ? ? ?Basic Metabolic Panel: ?Recent Labs  ?Lab 01/10/22 ?0142  ?NA 139  ?K 3.6  ?CL 107  ?CO2 25  ?GLUCOSE 108*  ?BUN 20  ?CREATININE 1.15*  ?CALCIUM 9.2  ? ?GFR: ?Estimated Creatinine Clearance: 67.8 mL/min (A) (by C-G formula based on SCr of 1.15  mg/dL (H)). ?Recent Labs  ?Lab 01/10/22 ?0142  ?WBC 6.9  ? ? ?Liver Function Tests: ?Recent Labs  ?Lab 01/10/22 ?0142  ?AST 19  ?ALT 16  ?ALKPHOS 81  ?BILITOT 0.6  ?PROT 6.9  ?ALBUMIN 3.8  ? ?No results for input(s): LIPASE, AMYLASE in the last 168 hours. ?No results for input(s): AMMONIA in the last 168 hours. ? ?ABG ?   ?Component Value Date/Time  ? HCO3 24.3 01/10/2022 0315  ? TCO2 23 02/09/2008 1422  ? ACIDBASEDEF 1.5 01/10/2022 0315  ? O2SAT 90.1 01/10/2022 0315  ?  ? ?Coagulation Profile: ?No results for input(s): INR, PROTIME in the last 168 hours. ? ?Cardiac Enzymes: ?No results for input(s): CKTOTAL, CKMB, CKMBINDEX, TROPONINI in the last 168 hours. ? ?HbA1C: ?No results found for: HGBA1C ? ?CBG: ?Recent Labs  ?Lab 01/10/22 ?0329  ?GLUCAP 162*  ? ? ?Review of Systems:   ?Sleepy, vomiting with dark and brb, stool with dark and bright red blood denies other + ROS although poor historian at this time.  ? ?Past Medical History:  ?She,  has a past medical history of Anxiety, GERD (gastroesophageal reflux disease), History of hiatal hernia, Hyperlipidemia, Hypertension, and Migraine.  ? ?Surgical History:  ? ?Past Surgical History:  ?Procedure Laterality Date  ? CESAREAN SECTION    ? x 2  ? CHOLECYSTECTOMY N/A 06/23/2018  ? Procedure: LAPAROSCOPIC CHOLECYSTECTOMY;  Surgeon: Erroll Luna, MD;  Location: [facility];  Service: General;  Laterality: N/A;  ? ESOPHAGOGASTRODUODENOSCOPY    ? LAPAROSCOPIC HYSTERECTOMY    ?  ? ?Social History:  ? reports that she has never smoked. She has never used smokeless tobacco. She reports current alcohol use. She reports that she does not use drugs.  ? ?Family History:  ?Her family history includes Migraines in her mother.  ? ?Allergies ?Allergies  ?Allergen Reactions  ? Latex Rash  ?  ? ?Home Medications  ?Prior to Admission medications   ?Medication Sig Start Date End Date Taking? Authorizing Provider  ?amLODipine (NORVASC) 5 MG tablet Take 5 mg by mouth daily.    [provider]  ?benzonatate (TESSALON) 100 MG capsule Take 1 capsule (100 mg total) by mouth every 8 (eight) hours. 08/28/20   Hall-Potvin, [place], PA-C  ?cetirizine (ZYRTEC ALLERGY) 10 MG tablet Take 1 tablet (10 mg total) by mouth daily. 08/28/20   Hall-Potvin, [place], PA-C  ?cholecalciferol (VITAMIN D3) 25 MCG (1000 UNIT) tablet Take 1,000 Units by mouth daily.    [provider]  ?fluticasone (FLONASE) 50 MCG/ACT nasal spray Place 1 spray into both nostrils daily. 08/28/20   Hall-Potvin, [place], PA-C  ?Fremanezumab-vfrm (AJOVY [place]) Inject into the skin.    [provider]  ?iron polysaccharides (NIFEREX) 150 MG capsule Take 150 mg by mouth daily.    [provider]  ?losartan-hydrochlorothiazide (HYZAAR) 100-25 MG tablet Take 1 tablet by mouth daily. 06/05/18   [provider]  ?pravastatin (PRAVACHOL) 20 MG tablet Take 20 mg  by mouth daily. Takes in the evening    [provider]  ?rizatriptan (MAXALT) 10 MG tablet Take 10 mg by mouth as needed for migraine. May repeat in 2 hours if needed    [provider]  ?SUMAtriptan (IMITREX) 100 MG tablet Take 100 mg by mouth every 2 (two) hours as needed.  04/06/15 06/06/26  [provider]  ?tiZANidine (ZANAFLEX) 4 MG capsule Take 4 mg by mouth every 8 (eight) hours as needed ([place]).     [provider]  ?zonisamide (ZONEGRAN) 100 MG capsule Take 100 mg by mouth daily.    [provider]  ?  ? ?Critical care time: 52 min critical care time between 0300-0700 ?  ? ? ? ? ? ?

## 2022-01-10 NOTE — Progress Notes (Signed)
Lorry, RN picked up patient from room to transport to endo. Patient alert, vital signs stable.  ?

## 2022-01-10 NOTE — Transfer of Care (Signed)
Immediate Anesthesia Transfer of Care Note ? ?Patient: Cynthia Tapia ? ?Procedure(s) Performed: ESOPHAGOGASTRODUODENOSCOPY (EGD) ?BIOPSY ? ?Patient Location: PACU ? ?Anesthesia Type:General ? ?Level of Consciousness: sedated ? ?Airway & Oxygen Therapy: Patient Spontanous Breathing and Patient connected to face mask oxygen ? ?Post-op Assessment: Report given to RN and Post -op Vital signs reviewed and stable ? ?Post vital signs: Reviewed and stable ? ?Last Vitals:  ?Vitals Value Taken Time  ?BP 91/53 01/10/22 1450  ?Temp 36.7 ?C 01/10/22 1447  ?Pulse 83 01/10/22 1454  ?Resp 21 01/10/22 1454  ?SpO2 97 % 01/10/22 1454  ?Vitals shown include unvalidated device data. ? ?Last Pain:  ?Vitals:  ? 01/10/22 1447  ?TempSrc: Temporal  ?PainSc:   ?   ? ?  ? ?Complications: No notable events documented. ?

## 2022-01-10 NOTE — Op Note (Signed)
[facility] ?Patient Name: Cynthia Tapia ?Procedure Date: 01/10/2022 ?MRN: [id number] ?Attending MD: Otis Brace , MD ?Date of Birth: June 03, 1966 ?CSN: [id number] ?Age: 56 ?Admit Type: Inpatient ?Procedure:                Upper GI endoscopy ?Indications:              Coffee-ground emesis ?Providers:                Otis Brace, MD, Ervin Knack, Justina  ?                          Purcell Nails, Merchant [employer], [employer] ?Referring MD:              ?Medicines:                Sedation Administered by an Anesthesia Professional ?Complications:            No immediate complications. ?Estimated Blood Loss:     Estimated blood loss was minimal. ?Procedure:                Pre-Anesthesia Assessment: ?                          - Prior to the procedure, a History and Physical  ?                          was performed, and patient medications and  ?                          allergies were reviewed. The patient's tolerance of  ?                          previous anesthesia was also reviewed. The risks  ?                          and benefits of the procedure and the sedation  ?                          options and risks were discussed with the patient.  ?                          All questions were answered, and informed consent  ?                          was obtained. Prior Anticoagulants: The patient has  ?                          taken no previous anticoagulant or antiplatelet  ?                          agents. ASA Grade Assessment: II - A patient with  ?                          mild systemic disease. After reviewing the risks  ?  and benefits, the patient was deemed in  ?                          satisfactory condition to undergo the procedure. ?                          After obtaining informed consent, the endoscope was  ?                          passed under direct vision. Throughout the  ?                          procedure, the patient's blood pressure, pulse, and  ?                           oxygen saturations were monitored continuously. The  ?                          [id number]-H190 ([id number]) Olympus endoscope was introduced  ?                          through the mouth, and advanced to the second part  ?                          of duodenum. The upper GI endoscopy was  ?                          accomplished without difficulty. The patient  ?                          tolerated the procedure well. ?Scope In: ?Scope Out: ?Findings: ?     NG tube were found in the entire esophagus. Removal was accomplished. ?     LA Grade B (one or more mucosal breaks greater than 5 mm, not extending  ?     between the tops of two mucosal folds) esophagitis with no bleeding was  ?     found at the gastroesophageal junction. Some residual activated charcoal  ?     seen at GE junction. ?     Few non-bleeding superficial gastric ulcers with pigmented material were  ?     found in the gastric antrum. The largest lesion was 4 mm in largest  ?     dimension. Biopsies were taken with a cold forceps for histology. ?     The cardia and gastric fundus were normal on retroflexion. ?     The duodenal bulb, first portion of the duodenum and second portion of  ?     the duodenum were normal. ?Impression:               - NG tube were found in the esophagus. Removal was  ?                          successful. ?                          - LA Grade B esophagitis with no bleeding. ?                          -  Non-bleeding gastric ulcers with pigmented  ?                          material. Biopsied. ?                          - Normal duodenal bulb, first portion of the  ?                          duodenum and second portion of the duodenum. ?Moderate Sedation: ?     Moderate (conscious) sedation was personally administered by an  ?     anesthesia professional. The following parameters were monitored: oxygen  ?     saturation, heart rate, blood pressure, and response to care. ?Recommendation:           - Return patient to  hospital ward for ongoing care. ?                          - Soft diet. ?                          - Continue present medications. ?                          - Await pathology results. ?                          - Use Protonix (pantoprazole) 40 mg PO BID. ?Procedure Code(s):        --- Professional --- ?                          [phone], Esophagogastroduodenoscopy, flexible,  ?                          transoral; with removal of foreign body(s) ?                          74081, Esophagogastroduodenoscopy, flexible,  ?                          transoral; with biopsy, single or multiple ?Diagnosis Code(s):        --- Professional --- ?                          K48.185U, Other foreign object in esophagus causing  ?                          other injury, initial encounter ?                          K20.90, Esophagitis, unspecified without bleeding ?                          K25.9, Gastric ulcer, unspecified as acute or  ?                          chronic, without hemorrhage or perforation ?  K92.0, Hematemesis ?CPT copyright 2019 American Medical Association. All rights reserved. ?The codes documented in this report are preliminary and upon coder review may  ?be revised to meet current compliance requirements. ?Otis Brace, MD ?Otis Brace, MD ?01/10/2022 2:43:10 PM ?Number of Addenda: 0 ?

## 2022-01-10 NOTE — ED Notes (Signed)
Poison Control called and recommend watching her for 10 hours on Cardiac monitor, EKG before clearing, Fluids for hypotension, Atropine for significant bradycardia,  ?

## 2022-01-10 NOTE — Consult Note (Signed)
[facility] Face-to-Face Psychiatry Consult  ? ?Reason for Consult:   ?Referring Physician:   ?Patient Identification: Cynthia Tapia ?MRN:  [id number] ?Principal Diagnosis: Overdose ?Diagnosis:  Principal Problem: ?  Overdose ? ? ?Total Time spent with patient: 1 hour ? ?Subjective:   ?Cynthia Tapia is a 56 y.o. female patient admitted with suicide attempt by overdose.  Patient presented after intentional overdose on medications, and intent to end her life.  Patient states she took all of her phenobarbital, Toradol, 21 Klonopin, all of her tizanidine, all of her rizatriptan, and drank a mini bottle of champagne.  Some of the medications listed above are different from initial intake, in which she reported taking tizanidine, Toradol, Klonopin,zonegran, and lamictal.  Patient reports prior to her suicide attempt, she had a [facility]  She states that this session was very difficult " brought up a lot of trauma, and was very upsetting.  All I can think about was my family, and them knowing the truth about my life.  I did not want them to know about my life."  Patient states she tried very hard to prevent her suicide attempt, by contacting her therapist after leaving his office to discuss her plan.  She states" I did speak with Marya Amsler, and he told me to put the pills away.  I put the pills away, but took them back out. I couldn't handled it anymore. I really wanted to live but I didn't want my family to know. "  ? ?Patient states she has more than 3 personalities, from what I ascertain she has groups of "Big Amy's and Little Amy's".  She reports a diagnosis of severe dissociative identity disorder and PTSD.  She reports prior to her therapy session, she did not know who is going to show up and be present at her therapy.  Patient endorses seeing Wende Bushy weekly for the past year, she is also receiving medication management from Dr. Bevelyn Ngo.  She is currently  prescribed lamictal, clonidine, clonazepam, fluoxetine.  She reports compliance with her psychotropic medication.  She endorses previous suicidal thoughts, that were all self avoided with within the past year that include carbon monoxide poisoning via blocking car, overdose on pills, and attempted to cut herself.  Patient denies any illicit substance use, alcohol use, and or nicotine.  She denies any legal charges.  She does endorse family history to include mother diagnosis of bipolar, borderline personality disorder, severe alcohol use disorder, maternal grandfather narcissistic personality disorder, maternal uncle narcissistic personality disorder, and paternal grandfather who completed suicide by gunshot.  She does endorse history of multiple traumas throughout her childhood.  She currently lives at home with her husband and family dog.  She also has 2 children ages 84 and 28.  ? ?Patient is seen and assessed by the psychiatric nurse practitioner.  Her family to include her husband, son, and best friend who are present in the room.  Consent is obtained to complete psychiatric evaluation in their presence.  Patient is observed to fluctuate throughout psychiatric evaluation with moods, voice, and alertness.  She admits to recent suicide attempt by overdose, although she was able to follow her safety plan, identifying members of her support system, and attempt to self abort she did complete her attempt.  Patient continues to be visibly traumatized, distraught, and upset with herself requiring crisis stabilization in the inpatient psychiatric unit.  She initially denied any previous inpatient hospitalization, however  states she was at [facility] once before.  Patient and family members both agree to inpatient psychiatric hospitalization, goals of care, course of expectation, and treatment were discussed with all parties.  Support, really encouragement, reassurance were offered.  Patient continues to endorse suicidal  ideations, remains unstable at this time and does want inpatient psychiatric admission once medically stable. ? ? ? ?HPI:  56 yo female with pmh significant for dissociative disorder, anxiety, depression, ptsd, hyperlipidemia, htn, migraines who presented after intentional overdose of medications at home as she wanted to end her life. She was reportedly in normal state of health (with exception of recent uti that she just finished abx for) until this afternoon. Per family and friend report pt was at her baseline this morning, pleasant without issues. Later in the day she had a therapy session and she states it was "bad" and since then she has been "spiraling".  ?  ?Pt's husband called her therapist numerous times over the course of the day for assistance with helping pt cope. Unfortunately, she continued to feel worse and wanted to just "knock out and never wake up". She reportedly took approximately '60mg'$  klonopin, 80 mg toradol and '24mg'$  tizanidine.  ?  ?She does endorse having suicidal thoughts before and even as far as a plan but has never acted on it until this incident.  ?  ?When pt arrived she was altered but arousable. Ng was placed for activated charcoal but when tube was placed >1L of bloody gastric contents were removed. She reports that she has had black vomit and stool for some time now. She has a history of upper endoscopy for gastritis but never found any ulcerations.  ? ?Past Psychiatric History: See above ? ?Risk to Self: Yes ?Risk to Others: Denies ?Prior Inpatient Therapy: [facility] ?[facility] ? ?Past Medical History:  ?Past Medical History:  ?Diagnosis Date  ? Anxiety   ? GERD (gastroesophageal reflux disease)   ? History of hiatal hernia   ? Hyperlipidemia   ? Hypertension   ? Migraine   ?  ?Past Surgical History:  ?Procedure Laterality Date  ? CESAREAN SECTION    ? x 2  ? CHOLECYSTECTOMY N/A 06/23/2018  ? Procedure: LAPAROSCOPIC CHOLECYSTECTOMY;  Surgeon:  Erroll Luna, MD;  Location: [facility];  Service: General;  Laterality: N/A;  ? ESOPHAGOGASTRODUODENOSCOPY    ? LAPAROSCOPIC HYSTERECTOMY    ? ?Family History:  ?Family History  ?Problem Relation Age of Onset  ? Migraines Mother   ? ?Family Psychiatric  History: Significant family history see above ?Social History:  ?Social History  ? ?Substance and Sexual Activity  ?Alcohol Use Yes  ? Comment: occ  ?   ?Social History  ? ?Substance and Sexual Activity  ?Drug Use No  ?  ?Social History  ? ?Socioeconomic History  ? Marital status: Married  ?  Spouse name: Not on file  ? Number of children: Not on file  ? Years of education: Not on file  ? Highest education level: Not on file  ?Occupational History  ? Not on file  ?Tobacco Use  ? Smoking status: Never  ? Smokeless tobacco: Never  ?Vaping Use  ? Vaping Use: Never used  ?Substance and Sexual Activity  ? Alcohol use: Yes  ?  Comment: occ  ? Drug use: No  ? Sexual activity: Not on file  ?Other Topics Concern  ? Not on file  ?Social History Narrative  ? Not on file  ? ?  Social Determinants of Health  ? ?Financial Resource Strain: Not on file  ?Food Insecurity: Not on file  ?Transportation Needs: Not on file  ?Physical Activity: Not on file  ?Stress: Not on file  ?Social Connections: Not on file  ? ?Additional Social History: ?  ? ?Allergies:   ?Allergies  ?Allergen Reactions  ? Latex Rash  ? ? ?Labs:  ?Results for orders placed or performed during the hospital encounter of 01/10/22 (from the past 48 hour(s))  ?Comprehensive metabolic panel     Status: Abnormal  ? Collection Time: 01/10/22  1:42 AM  ?Result Value Ref Range  ? Sodium 139 135 - 145 mmol/L  ? Potassium 3.6 3.5 - 5.1 mmol/L  ? Chloride 107 98 - 111 mmol/L  ? CO2 25 22 - 32 mmol/L  ? Glucose, Bld 108 (H) 70 - 99 mg/dL  ?  Comment: Glucose reference range applies only to samples taken after fasting for at least 8 hours.  ? BUN 20 6 - 20 mg/dL  ? Creatinine, Ser 1.15 (H) 0.44 - 1.00 mg/dL  ? Calcium 9.2 8.9 - 10.3  mg/dL  ? Total Protein 6.9 6.5 - 8.1 g/dL  ? Albumin 3.8 3.5 - 5.0 g/dL  ? AST 19 15 - 41 U/L  ? ALT 16 0 - 44 U/L  ? Alkaline Phosphatase 81 38 - 126 U/L  ? Total Bilirubin 0.6 0.3 - 1.2 mg/dL  ? GFR, Estimat

## 2022-01-10 NOTE — ED Provider Notes (Signed)
?[facility] DEPT ?Provider Note ? ? ?CSN: [id number] ?Arrival date & time: 01/10/22  0050 ? ?  ? ?History ? ?Chief Complaint  ?Patient presents with  ? Drug Overdose  ? ? ?Cynthia Tapia is a 56 y.o. female. ? ?The history is provided by the patient and the EMS personnel. The history is limited by the condition of the patient.  ?Drug Overdose ?This is a new problem. Episode onset: 1 hour PTA. The problem occurs rarely. The problem has not changed since onset.Pertinent negatives include no chest pain, no abdominal pain, no headaches and no shortness of breath. Associated symptoms comments: Has had hematemesis for . Nothing aggravates the symptoms. Nothing relieves the symptoms. She has tried nothing for the symptoms. The treatment provided no relief.  ?Patient with anxiety and gerd presents with intentional overdose of Klonopin 42-60 mg, trazedone 1500 mg, 80 mg of Toradol, unknown quantity of zonegran and lamictal within 1 hour of arrival.  She states she was trying to commit suicide, noting husband usually manages her medications but then she got them tonight and took large quantities of pills.  Also of note she admits to several days or vomiting both red blood and hematemesis.   ?  ?Past Medical History:  ?Diagnosis Date  ? Anxiety   ? GERD (gastroesophageal reflux disease)   ? History of hiatal hernia   ? Hyperlipidemia   ? Hypertension   ? Migraine   ? ? ?Home Medications ?Prior to Admission medications   ?Medication Sig Start Date End Date Taking? Authorizing Provider  ?amLODipine (NORVASC) 5 MG tablet Take 5 mg by mouth daily.    [provider]  ?benzonatate (TESSALON) 100 MG capsule Take 1 capsule (100 mg total) by mouth every 8 (eight) hours. 08/28/20   Hall-Potvin, [place], PA-C  ?cetirizine (ZYRTEC ALLERGY) 10 MG tablet Take 1 tablet (10 mg total) by mouth daily. 08/28/20   Hall-Potvin, [place], PA-C  ?cholecalciferol (VITAMIN D3) 25 MCG (1000 UNIT) tablet Take 1,000  Units by mouth daily.    [provider]  ?fluticasone (FLONASE) 50 MCG/ACT nasal spray Place 1 spray into both nostrils daily. 08/28/20   Hall-Potvin, [place], PA-C  ?Fremanezumab-vfrm (AJOVY [place]) Inject into the skin.    [provider]  ?iron polysaccharides (NIFEREX) 150 MG capsule Take 150 mg by mouth daily.    [provider]  ?losartan-hydrochlorothiazide (HYZAAR) 100-25 MG tablet Take 1 tablet by mouth daily. 06/05/18   [provider]  ?pravastatin (PRAVACHOL) 20 MG tablet Take 20 mg by mouth daily. Takes in the evening    [provider]  ?rizatriptan (MAXALT) 10 MG tablet Take 10 mg by mouth as needed for migraine. May repeat in 2 hours if needed    [provider]  ?SUMAtriptan (IMITREX) 100 MG tablet Take 100 mg by mouth every 2 (two) hours as needed.  04/06/15 06/06/26  [provider]  ?tiZANidine (ZANAFLEX) 4 MG capsule Take 4 mg by mouth every 8 (eight) hours as needed ([place]).     [provider]  ?zonisamide (ZONEGRAN) 100 MG capsule Take 100 mg by mouth daily.    [provider]  ?   ? ?Allergies    ?Latex   ? ?Review of Systems   ?Review of Systems  ?Unable to perform ROS: Acuity of condition  ?HENT:  Negative for facial swelling.   ?Respiratory:  Negative for shortness of breath.   ?Cardiovascular:  Negative for chest pain.  ?Gastrointestinal:  Positive  for vomiting. Negative for abdominal pain.  ?Neurological:  Negative for headaches.  ?Psychiatric/Behavioral:  Positive for suicidal ideas.   ? ?Physical Exam ?Updated Vital Signs ?BP 119/72   Pulse 89   Resp 12   Ht '5\' 8"'$  (1.727 m)   Wt 96.2 kg   SpO2 99%   BMI 32.23 kg/m?  ?Physical Exam ?Vitals and nursing note reviewed. Exam conducted with a chaperone present.  ?Constitutional:   ?   Comments: Somnolent but responsive   ?HENT:  ?   Head: Normocephalic and atraumatic.  ?   Nose: Nose normal.  ?Eyes:  ?   Conjunctiva/sclera: Conjunctivae normal.  ?    Pupils: Pupils are equal, round, and reactive to light.  ?Cardiovascular:  ?   Rate and Rhythm: Normal rate and regular rhythm.  ?   Pulses: Normal pulses.  ?   Heart sounds: Normal heart sounds.  ?Pulmonary:  ?   Effort: Pulmonary effort is normal.  ?   Breath sounds: Normal breath sounds.  ?Abdominal:  ?   General: Bowel sounds are normal.  ?   Palpations: Abdomen is soft.  ?   Tenderness: There is no abdominal tenderness. There is no guarding.  ?Genitourinary: ?   Rectum: Guaiac result positive.  ?Musculoskeletal:     ?   General: Normal range of motion.  ?   Cervical back: Normal range of motion and neck supple.  ?Skin: ?   General: Skin is warm and dry.  ?   Capillary Refill: Capillary refill takes less than 2 seconds.  ?Neurological:  ?   General: No focal deficit present.  ?Psychiatric:     ?   Thought Content: Thought content includes suicidal ideation. Thought content does not include homicidal ideation. Thought content includes suicidal plan.  ? ? ?ED Results / Procedures / Treatments   ?Labs ?(all labs ordered are listed, but only abnormal results are displayed) ?Results for orders placed or performed during the hospital encounter of 01/10/22  ?Resp Panel by RT-PCR (Flu A&B, Covid) Urine, Catheterized  ? Specimen: Urine, Catheterized; Nasopharyngeal(NP) swabs in vial transport medium  ?Result Value Ref Range  ? SARS Coronavirus 2 by RT PCR NEGATIVE NEGATIVE  ? Influenza A by PCR NEGATIVE NEGATIVE  ? Influenza B by PCR NEGATIVE NEGATIVE  ?Comprehensive metabolic panel  ?Result Value Ref Range  ? Sodium 139 135 - 145 mmol/L  ? Potassium 3.6 3.5 - 5.1 mmol/L  ? Chloride 107 98 - 111 mmol/L  ? CO2 25 22 - 32 mmol/L  ? Glucose, Bld 108 (H) 70 - 99 mg/dL  ? BUN 20 6 - 20 mg/dL  ? Creatinine, Ser 1.15 (H) 0.44 - 1.00 mg/dL  ? Calcium 9.2 8.9 - 10.3 mg/dL  ? Total Protein 6.9 6.5 - 8.1 g/dL  ? Albumin 3.8 3.5 - 5.0 g/dL  ? AST 19 15 - 41 U/L  ? ALT 16 0 - 44 U/L  ? Alkaline Phosphatase 81 38 - 126 U/L  ? Total  Bilirubin 0.6 0.3 - 1.2 mg/dL  ? GFR, Estimated 57 (L) >60 mL/min  ? Anion gap 7 5 - 15  ?Salicylate level  ?Result Value Ref Range  ? Salicylate Lvl <1.0 (L) 7.0 - 30.0 mg/dL  ?Acetaminophen level  ?Result Value Ref Range  ? Acetaminophen (Tylenol), Serum <10 (L) 10 - 30 ug/mL  ?Ethanol  ?Result Value Ref Range  ? Alcohol, Ethyl (B) <10 <10 mg/dL  ?Urine rapid drug screen (hosp performed)  ?Result Value  Ref Range  ? Opiates NONE DETECTED NONE DETECTED  ? Cocaine NONE DETECTED NONE DETECTED  ? Benzodiazepines POSITIVE (A) NONE DETECTED  ? Amphetamines NONE DETECTED NONE DETECTED  ? Tetrahydrocannabinol NONE DETECTED NONE DETECTED  ? Barbiturates NONE DETECTED NONE DETECTED  ?CBC WITH DIFFERENTIAL  ?Result Value Ref Range  ? WBC 6.9 4.0 - 10.5 K/uL  ? RBC 4.52 3.87 - 5.11 MIL/uL  ? Hemoglobin 12.8 12.0 - 15.0 g/dL  ? HCT 39.2 36.0 - 46.0 %  ? MCV 86.7 80.0 - 100.0 fL  ? MCH 28.3 26.0 - 34.0 pg  ? MCHC 32.7 30.0 - 36.0 g/dL  ? RDW 13.5 11.5 - 15.5 %  ? Platelets 296 150 - 400 K/uL  ? nRBC 0.0 0.0 - 0.2 %  ? Neutrophils Relative % 56 %  ? Neutro Abs 3.8 1.7 - 7.7 K/uL  ? Lymphocytes Relative 31 %  ? Lymphs Abs 2.2 0.7 - 4.0 K/uL  ? Monocytes Relative 9 %  ? Monocytes Absolute 0.6 0.1 - 1.0 K/uL  ? Eosinophils Relative 3 %  ? Eosinophils Absolute 0.2 0.0 - 0.5 K/uL  ? Basophils Relative 1 %  ? Basophils Absolute 0.0 0.0 - 0.1 K/uL  ? Immature Granulocytes 0 %  ? Abs Immature Granulocytes 0.03 0.00 - 0.07 K/uL  ?Blood gas, venous (at [facility] and [facility], not at [facility])  ?Result Value Ref Range  ? pH, Ven 7.34 7.25 - 7.43  ? pCO2, Ven 45 44 - 60 mmHg  ? pO2, Ven 59 (H) 32 - 45 mmHg  ? Bicarbonate 24.3 20.0 - 28.0 mmol/L  ? Acid-base deficit 1.5 0.0 - 2.0 mmol/L  ? O2 Saturation 90.1 %  ? Patient temperature 37.7   ? Drawn by [id number]   ?CBG monitoring, ED  ?Result Value Ref Range  ? Glucose-Capillary 162 (H) 70 - 99 mg/dL  ?I-Stat beta hCG blood, ED  ?Result Value Ref Range  ? I-stat hCG, quantitative 6.5 (H) <5 mIU/mL  ? Comment 3           ?POC occult blood, ED Provider will collect  ?Result Value Ref Range  ? Fecal Occult Bld POSITIVE (A) NEGATIVE  ?Type and screen  ?Result Value Ref Range  ? ABO/RH(D) A NEG   ? Antibody Screen NEG   ? Sample

## 2022-01-10 NOTE — Progress Notes (Signed)
PCCM progress note ? ?Patient admitted overnight for intentional drug overdose in a suicide attempt.  She was admitted early a.m. of 4/28.  Please see formal H&P for full details. ? ?On a.m. assessment she remains hemodynamically stable with continued suicidal ideations.  Psychiatry has been consulted.  Safety sitter at bedside ? ?Given persistent melena with anemia and GI consulted and plans to perform EGD 4/28 ? ?Plan to continue IV bicarb drip per poison control's recommendation. ? ?Mirel Hundal D. Harris, NP-C ?[facility] ?Personal contact information can be found on Amion  ?01/10/2022, 9:49 AM ? ? ?

## 2022-01-11 DIAGNOSIS — T50901D Poisoning by unspecified drugs, medicaments and biological substances, accidental (unintentional), subsequent encounter: Secondary | ICD-10-CM | POA: Diagnosis not present

## 2022-01-11 LAB — CBC
HCT: 37 % (ref 36.0–46.0)
Hemoglobin: 11.8 g/dL — ABNORMAL LOW (ref 12.0–15.0)
MCH: 28 pg (ref 26.0–34.0)
MCHC: 31.9 g/dL (ref 30.0–36.0)
MCV: 87.7 fL (ref 80.0–100.0)
Platelets: 278 10*3/uL (ref 150–400)
RBC: 4.22 MIL/uL (ref 3.87–5.11)
RDW: 13.5 % (ref 11.5–15.5)
WBC: 7.8 10*3/uL (ref 4.0–10.5)
nRBC: 0 % (ref 0.0–0.2)

## 2022-01-11 LAB — BASIC METABOLIC PANEL
Anion gap: 8 (ref 5–15)
BUN: 11 mg/dL (ref 6–20)
CO2: 24 mmol/L (ref 22–32)
Calcium: 8.7 mg/dL — ABNORMAL LOW (ref 8.9–10.3)
Chloride: 109 mmol/L (ref 98–111)
Creatinine, Ser: 0.89 mg/dL (ref 0.44–1.00)
GFR, Estimated: >60 mL/min (ref >60)
Glucose, Bld: 166 mg/dL — ABNORMAL HIGH (ref 70–99)
Potassium: 3.9 mmol/L (ref 3.5–5.1)
Sodium: 141 mmol/L (ref 135–145)

## 2022-01-11 LAB — MAGNESIUM: Magnesium: 2.1 mg/dL (ref 1.7–2.4)

## 2022-01-11 LAB — PHOSPHORUS: Phosphorus: 2.5 mg/dL (ref 2.5–4.6)

## 2022-01-11 MED ORDER — GUAIFENESIN 100 MG/5ML PO LIQD
5.0000 mL | ORAL | Status: DC | PRN
  Administered 2022-01-11: 5 mL via ORAL
  Filled 2022-01-11: qty 10

## 2022-01-11 MED ORDER — CLONAZEPAM 1 MG PO TABS
2.0000 mg | ORAL_TABLET | Freq: Every day | ORAL | Status: DC | PRN
Start: 2022-01-11 — End: 2022-01-13
  Administered 2022-01-11 – 2022-01-13 (×3): 2 mg via ORAL
  Filled 2022-01-11 (×4): qty 2

## 2022-01-11 MED ORDER — VITAMIN D (ERGOCALCIFEROL) 1.25 MG (50000 UNIT) PO CAPS
50000.0000 [IU] | ORAL_CAPSULE | ORAL | Status: DC
  Administered 2022-01-11: 50000 [IU] via ORAL
  Filled 2022-01-11: qty 1

## 2022-01-11 MED ORDER — RIZATRIPTAN BENZOATE 10 MG PO TABS
10.0000 mg | ORAL_TABLET | Freq: Every day | ORAL | Status: DC | PRN
  Filled 2022-01-11: qty 1

## 2022-01-11 MED ORDER — PRAVASTATIN SODIUM 20 MG PO TABS
20.0000 mg | ORAL_TABLET | Freq: Every morning | ORAL | Status: DC
  Administered 2022-01-11 – 2022-01-13 (×3): 20 mg via ORAL
  Filled 2022-01-11 (×3): qty 1

## 2022-01-11 MED ORDER — ZONISAMIDE 100 MG PO CAPS
100.0000 mg | ORAL_CAPSULE | Freq: Every day | ORAL | Status: DC
  Administered 2022-01-11 – 2022-01-13 (×3): 100 mg via ORAL
  Filled 2022-01-11 (×3): qty 1

## 2022-01-11 MED ORDER — LAMOTRIGINE 100 MG PO TABS
100.0000 mg | ORAL_TABLET | Freq: Every day | ORAL | Status: DC
Start: 2022-01-12 — End: 2022-01-13
  Administered 2022-01-12 – 2022-01-13 (×2): 100 mg via ORAL
  Filled 2022-01-11 (×2): qty 1

## 2022-01-11 MED ORDER — POLYSACCHARIDE IRON COMPLEX 150 MG PO CAPS
150.0000 mg | ORAL_CAPSULE | Freq: Every morning | ORAL | Status: DC
  Administered 2022-01-11 – 2022-01-13 (×3): 150 mg via ORAL
  Filled 2022-01-11 (×3): qty 1

## 2022-01-11 MED ORDER — FLUOXETINE HCL 20 MG PO CAPS
60.0000 mg | ORAL_CAPSULE | Freq: Every morning | ORAL | Status: DC
  Administered 2022-01-11 – 2022-01-13 (×3): 60 mg via ORAL
  Filled 2022-01-11 (×3): qty 3

## 2022-01-11 MED ORDER — NAPHAZOLINE-GLYCERIN 0.012-0.25 % OP SOLN
1.0000 [drp] | Freq: Four times a day (QID) | OPHTHALMIC | Status: DC | PRN
  Administered 2022-01-11: 2 [drp] via OPHTHALMIC
  Filled 2022-01-11: qty 15

## 2022-01-11 NOTE — Plan of Care (Signed)
  Problem: Clinical Measurements: Goal: Respiratory complications will improve Outcome: Progressing Goal: Cardiovascular complication will be avoided Outcome: Progressing   Problem: Activity: Goal: Risk for activity intolerance will decrease Outcome: Progressing   Problem: Nutrition: Goal: Adequate nutrition will be maintained Outcome: Progressing   Problem: Coping: Goal: Level of anxiety will decrease Outcome: Progressing   Problem: Elimination: Goal: Will not experience complications related to bowel motility Outcome: Progressing Goal: Will not experience complications related to urinary retention Outcome: Progressing   Problem: Pain Managment: Goal: General experience of comfort will improve Outcome: Progressing   

## 2022-01-11 NOTE — Progress Notes (Signed)
? ?NAME:  Cynthia Tapia, MRN:  [id number], DOB:  Sep 09, 1966, LOS: 1 ?ADMISSION DATE:  01/10/2022, CONSULTATION DATE:  01/10/22 ?REFERRING MD:  edp, CHIEF COMPLAINT:  intentional overdose  ? ?History of Present Illness:  ?56 yo female with pmh significant for dissociative disorder, anxiety, depression, ptsd, hyperlipidemia, htn, migraines who presented after intentional overdose of medications at home as she wanted to end her life. She was reportedly in normal state of health (with exception of recent uti that she just finished abx for) until this afternoon. Per family and friend report pt was at her baseline this morning, pleasant without issues. Later in the day she had a therapy session and she states it was "bad" and since then she has been "spiraling".  ? ?Pt's husband called her therapist numerous times over the course of the day for assistance with helping pt cope. Unfortunately, she continued to feel worse and wanted to just "knock out and never wake up". She reportedly took approximately '60mg'$  klonopin, 80 mg toradol and '24mg'$  tizanidine.  ? ?She does endorse having suicidal thoughts before and even as far as a plan but has never acted on it until this incident.  ? ?When pt arrived she was altered but arousable. Ng was placed for activated charcoal but when tube was placed >1L of bloody gastric contents were removed. She reports that she has had black vomit and stool for some time now. She has a history of upper endoscopy for gastritis but never found any ulcerations.  ? ? ?Pertinent  Medical History  ?As above ? ?Significant Hospital Events: ?Including procedures, antibiotic start and stop dates in addition to other pertinent events   ?4/29: Admitted to ICU for close monitoring, consult psych, EGD showed esophagitis/gastritis ? ?Interim History / Subjective:  ? ?Patient feeling ok this morning, feeling a bit cold.  ?No other complaints.  ? ? ?Objective   ?Blood pressure 130/74, pulse 66, temperature 97.9 ?F  (36.6 ?C), temperature source Axillary, resp. rate 20, height '5\' 8"'$  (1.727 m), weight 100.9 kg, SpO2 (!) 85 %. ?   ?   ? ?Intake/Output Summary (Last 24 hours) at 01/11/2022 0723 ?Last data filed at 01/11/2022 0623 ?Gross per 24 hour  ?Intake 3574.46 ml  ?Output 650 ml  ?Net 2924.46 ml  ? ?Filed Weights  ? 01/10/22 0205 01/11/22 0522  ?Weight: 96.2 kg 100.9 kg  ? ? ?Examination: ?General: Middle aged woman, no acute distress, resting in bed ?HENT: [place]/AT, moist mucous membranes, sclera anicteric ?Lungs: clear to auscultation bilaterally ?Cardiovascular: rrr, no murmurs ?Abdomen: soft, non tender, bs + ?Extremities: warm, no edema ?Neuro: awake and alert. No focal deficits. Follows commands ?GU: n/a ? ?Resolved Hospital Problem list   ? ? ?Assessment & Plan:  ?Intentional overdose: ?Suicide attempt:  ?Suicide ideation:  ?-1:1 ?-psych following ?-EKG with Qtc 403 today ?-Stop bicarb drip ? ?Toxic encephalopathy - improved ?-monitor ?-supportive care ?-s/p charcoal dosing.  ? ?AKI - improved ?-monitor indices ?-monitor uop ? ?GIB 2/2 gastritis and esophagitis:  ?-EGD 4/29 by GI ?- BID PPI IV while in hospital, then PO protonix '40mg'$  BID for 8 weeks, then '40mg'$  daily for 4 weeks. ?-Follow up with GI outpatient ?-hgb stable at this time.  ? ?Dissociative disorder ?Anxiety ?Depression ?Ptsd:  ?-noted and appreciate psych recommendations ?- resum home prozac, lamictal, and clonazepam PRN ? ?Hypertension:  ?-holing anti htn meds from home at this time: amlodipine, clonidine, olmesartan/hctz ? ?Hyperlipidemia:  ?-resume pravachol ? ?Best Practice (right click and "Reselect  all SmartList Selections" daily)  ? ?Diet/type: Regular consistency (see orders) ?DVT prophylaxis: SCD ?GI prophylaxis: PPI ?Lines: N/A ?Foley:  N/A ?Code Status:  full code ?Last date of multidisciplinary goals of care discussion [with pt and pt's husband at bedside 4/28] ? ?Labs   ?CBC: ?Recent Labs  ?Lab 01/10/22 ?0142 01/11/22 ?0222  ?WBC 6.9 7.8   ?NEUTROABS 3.8  --   ?HGB 12.8 11.8*  ?HCT 39.2 37.0  ?MCV 86.7 87.7  ?PLT 296 278  ? ? ?Basic Metabolic Panel: ?Recent Labs  ?Lab 01/10/22 ?0142 01/10/22 ?1011 01/11/22 ?0222  ?NA 139 142 141  ?K 3.6 3.4* 3.9  ?CL 107 111 109  ?CO2 '25 25 24  '$ ?GLUCOSE 108* 98 166*  ?BUN '20 14 11  '$ ?CREATININE 1.15* 0.87 0.89  ?CALCIUM 9.2 8.2* 8.7*  ?MG  --   --  2.1  ?PHOS  --   --  2.5  ? ?GFR: ?Estimated Creatinine Clearance: 89.8 mL/min (by C-G formula based on SCr of 0.89 mg/dL). ?Recent Labs  ?Lab 01/10/22 ?0142 01/11/22 ?0222  ?WBC 6.9 7.8  ? ? ?Liver Function Tests: ?Recent Labs  ?Lab 01/10/22 ?0142 01/10/22 ?1011  ?AST 19 14*  ?ALT 16 14  ?ALKPHOS 81 70  ?BILITOT 0.6 0.4  ?PROT 6.9 6.2*  ?ALBUMIN 3.8 3.4*  ? ?No results for input(s): LIPASE, AMYLASE in the last 168 hours. ?No results for input(s): AMMONIA in the last 168 hours. ? ?ABG ?   ?Component Value Date/Time  ? HCO3 24.3 01/10/2022 0315  ? TCO2 23 02/09/2008 1422  ? ACIDBASEDEF 1.5 01/10/2022 0315  ? O2SAT 90.1 01/10/2022 0315  ?  ? ?Coagulation Profile: ?No results for input(s): INR, PROTIME in the last 168 hours. ? ?Cardiac Enzymes: ?No results for input(s): CKTOTAL, CKMB, CKMBINDEX, TROPONINI in the last 168 hours. ? ?HbA1C: ?No results found for: HGBA1C ? ?CBG: ?Recent Labs  ?Lab 01/10/22 ?0329  ?GLUCAP 162*  ? ? ?Critical care time: n/a ?  ? ?Freda Jackson, MD ?[facility] ?Office: [phone] ? ? ?See Amion for personal pager ?PCCM on call pager [phone] until 7pm. ?Please call Elink 7p-7a. [phone] ? ? ? ? ?

## 2022-01-12 DIAGNOSIS — K920 Hematemesis: Secondary | ICD-10-CM

## 2022-01-12 DIAGNOSIS — N390 Urinary tract infection, site not specified: Secondary | ICD-10-CM

## 2022-01-12 DIAGNOSIS — K922 Gastrointestinal hemorrhage, unspecified: Secondary | ICD-10-CM

## 2022-01-12 DIAGNOSIS — N3000 Acute cystitis without hematuria: Secondary | ICD-10-CM

## 2022-01-12 DIAGNOSIS — F4310 Post-traumatic stress disorder, unspecified: Secondary | ICD-10-CM

## 2022-01-12 DIAGNOSIS — K253 Acute gastric ulcer without hemorrhage or perforation: Secondary | ICD-10-CM | POA: Diagnosis not present

## 2022-01-12 DIAGNOSIS — K209 Esophagitis, unspecified without bleeding: Secondary | ICD-10-CM | POA: Diagnosis not present

## 2022-01-12 DIAGNOSIS — T50912D Poisoning by multiple unspecified drugs, medicaments and biological substances, intentional self-harm, subsequent encounter: Secondary | ICD-10-CM

## 2022-01-12 DIAGNOSIS — F32A Depression, unspecified: Secondary | ICD-10-CM

## 2022-01-12 DIAGNOSIS — K259 Gastric ulcer, unspecified as acute or chronic, without hemorrhage or perforation: Secondary | ICD-10-CM

## 2022-01-12 DIAGNOSIS — F449 Dissociative and conversion disorder, unspecified: Secondary | ICD-10-CM

## 2022-01-12 DIAGNOSIS — G929 Unspecified toxic encephalopathy: Secondary | ICD-10-CM

## 2022-01-12 DIAGNOSIS — E860 Dehydration: Secondary | ICD-10-CM

## 2022-01-12 DIAGNOSIS — F419 Anxiety disorder, unspecified: Secondary | ICD-10-CM

## 2022-01-12 LAB — URINALYSIS, MICROSCOPIC (REFLEX): RBC / HPF: >50 RBC/hpf (ref 0–5)

## 2022-01-12 LAB — URINALYSIS, ROUTINE W REFLEX MICROSCOPIC
Glucose, UA: 100 mg/dL — AB
Ketones, ur: 40 mg/dL — AB
Nitrite: POSITIVE — AB
Protein, ur: >300 mg/dL — AB
Specific Gravity, Urine: 1.01 (ref 1.005–1.030)
pH: 7 (ref 5.0–8.0)

## 2022-01-12 MED ORDER — CEFADROXIL 500 MG PO CAPS
500.0000 mg | ORAL_CAPSULE | Freq: Two times a day (BID) | ORAL | Status: DC
  Administered 2022-01-12 – 2022-01-13 (×4): 500 mg via ORAL
  Filled 2022-01-12 (×4): qty 1

## 2022-01-12 NOTE — Progress Notes (Signed)
?  Progress Note ? ? ?Patient: Cynthia Tapia OVZ:[id number] DOB: 03/20/1966 DOA: 01/10/2022     2 ?DOS: the patient was seen and examined on 01/12/2022 ?  ?Brief hospital course: ?56 year old woman PMH dissociative disorder, anxiety, depression, PTSD presented with suicide attempt with intentional overdose of '60mg'$  Klonopin, '80mg'$  Toradol, and '24mg'$  Tizanidine.  NG tube in emergency department returned 1 L bloody gastric contents. ?--4/28 admit to ICU per PCCM, GI consult for EGD, psych consult w/ rec for inpt treatment; EGD LA grade B esophagitis, non-bleeding gastric ulcers ?--4/29 improved, off bicarb gtt ?--4/30 transferred to [facility] ? ?Assessment and Plan: ?* Suicide attempt by multiple drug overdose ([place]) ?--treated w/ bicarb gtt, QTc stable, transferred to [facility] 4/30 ?--medically stable ?--needs inpatient psyshciatric treatment ?--cannot leave AMA -- if attempts to do so, must be IVC'd. ? ?GIB (gastrointestinal bleeding)-resolved as of 01/12/2022 ?--sp EGD; secondary to EGD LA grade B esophagitis, non-bleeding gastric ulcers ?--plan as below ? ?Toxic encephalopathy-resolved as of 01/12/2022 ?--secondary to multidrug overdose ?--resolved ? ?UTI (urinary tract infection) ?--symptomatic ?--check u/a and culture ?--empiric cephalosporin ? ?Dehydration-resolved as of 01/12/2022 ?--resolved; did not meet criteria for AKI ? ?Gastric ulcer ?--PPI ? ?Esophagitis ?--BID PPI IV while in hospital, then PO protonix '40mg'$  BID for 8 weeks, then '40mg'$  daily for 4 weeks ? ?Dissociative disorder ?--continue management per psychiatry ? ? ? ? ?  ? ?Subjective:  ?--no issues charted overnight ?--feels ok today except for urinary hesitancy and burning; thinks she has a UTI again, recently had one ? ?Physical Exam: ?Vitals:  ? 01/11/22 1700 01/11/22 1740 01/12/22 0534 01/12/22 0700  ?BP: (!) 53/43 (!) 100/51 110/63   ?Pulse: 74 68 (!) 54   ?Resp:   18   ?Temp: 98.3 ?F (36.8 ?C)  98.1 ?F (36.7 ?C)   ?TempSrc:   Oral   ?SpO2: 92%  94%   ?Weight:     101.3 kg  ?Height:      ?Tmax 101.3 ?Level 5 ?Physical Exam ?Vitals and nursing note reviewed.  ?Constitutional:   ?   General: She is not in acute distress. ?   Appearance: She is not ill-appearing or toxic-appearing.  ?Cardiovascular:  ?   Rate and Rhythm: Normal rate and regular rhythm.  ?   Heart sounds: No murmur heard. ?   Comments: Telemetry SR ?Pulmonary:  ?   Effort: Pulmonary effort is normal. No respiratory distress.  ?   Breath sounds: No wheezing, rhonchi or rales.  ?Abdominal:  ?   General: There is no distension.  ?   Palpations: Abdomen is soft.  ?   Tenderness: There is no abdominal tenderness. There is no guarding.  ?Neurological:  ?   Mental Status: She is alert.  ?Psychiatric:     ?   Mood and Affect: Mood normal.     ?   Behavior: Behavior normal.  ?   Comments: Odd affect  ? ? ?Data Reviewed: ? ?BMP, Mg2+, P unremakrable ?LFTs unremarkable ?CBC stable ?CXR NAD ?AXR NAD ? ?Family Communication: husband at bedside ? ?Disposition: ?Status is: Inpatient ?Remains inpatient appropriate because: needs inpatient psychiatric treatment ? Planned Discharge Destination:  Inpatient psychiatric facility ? ? ? ?Time spent: 25 minutes ? ?Author: ?Murray Hodgkins, MD ?01/12/2022 1:32 PM ? ?For on call review [email or web address].  ?

## 2022-01-12 NOTE — Assessment & Plan Note (Addendum)
--  symptomatic, u/a grossly positive, culture pending ?--empiric cephalosporin ?

## 2022-01-12 NOTE — Hospital Course (Addendum)
56 year old woman PMH dissociative disorder, anxiety, depression, PTSD presented with suicide attempt with intentional overdose of '60mg'$  Klonopin, '80mg'$  Toradol, and '24mg'$  Tizanidine.  NG tube in emergency department returned 1 L bloody gastric contents. ?--4/28 admit to ICU per PCCM, GI consult for EGD, psych consult w/ rec for inpt treatment; EGD LA grade B esophagitis, non-bleeding gastric ulcers ?--4/29 improved, off bicarb gtt ?--4/30 transferred to [facility] ?--5/1 medically clear for transfer to inpatient psychiatric facility ?

## 2022-01-12 NOTE — Progress Notes (Signed)
RN called to room by [employer]. Pt woke from nap having a panic attack. Inconsolably crying. Husband at bedside, assisting patient with deep breathing. Pt states "they can't find me here, can they? The bad people won't be able to find me here?" Pt reassured that she is in a safe environment. IV Haldol given per PRN order.  ?

## 2022-01-12 NOTE — Assessment & Plan Note (Addendum)
--  treated w/ bicarb gtt, QTc stable, transferred to [facility] 4/30 ?--medically stable ?--needs inpatient psychiatric treatment ?--cannot leave AMA -- if attempts to do so, must be IVC'd. ?

## 2022-01-12 NOTE — Assessment & Plan Note (Addendum)
--  sp EGD; secondary to EGD LA grade B esophagitis, non-bleeding gastric ulcers ?--plan as below ?

## 2022-01-12 NOTE — Assessment & Plan Note (Signed)
--  continue management per psychiatry ?

## 2022-01-12 NOTE — Assessment & Plan Note (Signed)
--  resolved; did not meet criteria for AKI ?

## 2022-01-12 NOTE — Assessment & Plan Note (Signed)
PPI ?

## 2022-01-12 NOTE — TOC Initial Note (Signed)
Transition of Care (TOC) - Initial/Assessment Note  ? ? ?Patient Details  ?Name: Cynthia Tapia ?MRN: [id number] ?Date of Birth: 1966-06-20 ? ?Transition of Care (TOC) CM/SW Contact:    ?Tawanna Cooler, RN ?Phone Number: ?01/12/2022, 12:59 PM ? ?Clinical Narrative:                 ? ?Anticipate patient will need transfer to inpatient psych once medically cleared.  ?TOC following.  ? ? ?Expected Discharge Plan: [facility] ?Barriers to Discharge: Continued Medical Work up ? ? ?Expected Discharge Plan and Services ?Expected Discharge Plan: [facility] ?  ?  ?Living arrangements for the past 2 months: [place] ?                ?  ?Prior Living Arrangements/Services ?Living arrangements for the past 2 months: [place] ?Lives with:: Spouse ?Patient language and need for interpreter reviewed:: Yes ?       ?Need for Family Participation in Patient Care: Yes (Comment) ?Care giver support system in place?: Yes (comment) ?  ?Criminal Activity/Legal Involvement Pertinent to Current Situation/Hospitalization: No - Comment as needed ? ?Activities of Daily Living ?Home Assistive Devices/Equipment: None ?ADL Screening (condition at time of admission) ?Patient's cognitive ability adequate to safely complete daily activities?: No ?Is the patient deaf or have difficulty hearing?: No ?Does the patient have difficulty seeing, even when wearing glasses/contacts?: No ?Does the patient have difficulty concentrating, remembering, or making decisions?: Yes ?Patient able to express need for assistance with ADLs?: Yes ?Does the patient have difficulty dressing or bathing?: No ?Independently performs ADLs?: Yes (appropriate for developmental age) ?Does the patient have difficulty walking or climbing stairs?: No ?Weakness of Legs: None ?Weakness of Arms/Hands: None ? ?Emotional Assessment ?  ?Orientation: : Oriented to Self, Oriented to Place, Oriented to  Time, Oriented to Situation ?Alcohol / Substance  Use: Not Applicable ?Psych Involvement: Yes (comment) ? ?Admission diagnosis:  Overdose [T50.901A] ?Gastrointestinal hemorrhage, unspecified gastrointestinal hemorrhage type [K92.2] ?Hematemesis without nausea [K92.0] ?Intentional overdose, initial encounter ([place]) [T50.902A] ?Patient Active Problem List  ? Diagnosis Date Noted  ? Toxic encephalopathy 01/12/2022  ? AKI (acute kidney injury) ([place]) 01/12/2022  ? GIB (gastrointestinal bleeding) 01/12/2022  ? Dissociative disorder 01/12/2022  ? Anxiety 01/12/2022  ? Depression 01/12/2022  ? PTSD (post-traumatic stress disorder) 01/12/2022  ? Esophagitis 01/12/2022  ? Gastric ulcer 01/12/2022  ? UTI (urinary tract infection) 01/12/2022  ? Suicide attempt by multiple drug overdose ([place]) 01/10/2022  ? ?PCP:  Antony Contras, MD ?Pharmacy:   ?[id number] [id number]?Phone: [phone]: [phone]? ? ? ?

## 2022-01-12 NOTE — Assessment & Plan Note (Signed)
--  secondary to multidrug overdose ?--resolved ?

## 2022-01-12 NOTE — Assessment & Plan Note (Addendum)
--  BID PPI IV while in hospital, then PO protonix '40mg'$  BID for 8 weeks, then '40mg'$  daily for 4 weeks ?

## 2022-01-13 ENCOUNTER — Inpatient Hospital Stay (HOSPITAL_COMMUNITY)
Admission: AD | Admit: 2022-01-13 | Discharge: 2022-01-17 | DRG: 880 | Disposition: A | Discharge status: Home / Self Care | Payer: BC Managed Care – PPO | Source: Intra-hospital | Attending: Psychiatry | Admitting: Psychiatry

## 2022-01-13 DIAGNOSIS — G43009 Migraine without aura, not intractable, without status migrainosus: Secondary | ICD-10-CM | POA: Diagnosis present

## 2022-01-13 DIAGNOSIS — T50912A Poisoning by multiple unspecified drugs, medicaments and biological substances, intentional self-harm, initial encounter: Secondary | ICD-10-CM | POA: Diagnosis not present

## 2022-01-13 DIAGNOSIS — T426X2D Poisoning by other antiepileptic and sedative-hypnotic drugs, intentional self-harm, subsequent encounter: Secondary | ICD-10-CM | POA: Diagnosis not present

## 2022-01-13 DIAGNOSIS — K254 Chronic or unspecified gastric ulcer with hemorrhage: Secondary | ICD-10-CM | POA: Diagnosis present

## 2022-01-13 DIAGNOSIS — Z91040 Latex allergy status: Secondary | ICD-10-CM | POA: Diagnosis not present

## 2022-01-13 DIAGNOSIS — T398X2D Poisoning by other nonopioid analgesics and antipyretics, not elsewhere classified, intentional self-harm, subsequent encounter: Secondary | ICD-10-CM | POA: Diagnosis not present

## 2022-01-13 DIAGNOSIS — K209 Esophagitis, unspecified without bleeding: Secondary | ICD-10-CM | POA: Diagnosis not present

## 2022-01-13 DIAGNOSIS — T50902A Poisoning by unspecified drugs, medicaments and biological substances, intentional self-harm, initial encounter: Secondary | ICD-10-CM | POA: Diagnosis present

## 2022-01-13 DIAGNOSIS — K2100 Gastro-esophageal reflux disease with esophagitis, without bleeding: Secondary | ICD-10-CM | POA: Diagnosis present

## 2022-01-13 DIAGNOSIS — I10 Essential (primary) hypertension: Secondary | ICD-10-CM | POA: Diagnosis present

## 2022-01-13 DIAGNOSIS — N390 Urinary tract infection, site not specified: Secondary | ICD-10-CM | POA: Diagnosis present

## 2022-01-13 DIAGNOSIS — F430 Acute stress reaction: Principal | ICD-10-CM | POA: Diagnosis present

## 2022-01-13 DIAGNOSIS — Z8249 Family history of ischemic heart disease and other diseases of the circulatory system: Secondary | ICD-10-CM

## 2022-01-13 DIAGNOSIS — F4310 Post-traumatic stress disorder, unspecified: Secondary | ICD-10-CM | POA: Diagnosis present

## 2022-01-13 DIAGNOSIS — F419 Anxiety disorder, unspecified: Secondary | ICD-10-CM | POA: Diagnosis present

## 2022-01-13 DIAGNOSIS — K922 Gastrointestinal hemorrhage, unspecified: Secondary | ICD-10-CM

## 2022-01-13 DIAGNOSIS — F449 Dissociative and conversion disorder, unspecified: Secondary | ICD-10-CM | POA: Diagnosis present

## 2022-01-13 DIAGNOSIS — T43212D Poisoning by selective serotonin and norepinephrine reuptake inhibitors, intentional self-harm, subsequent encounter: Secondary | ICD-10-CM | POA: Diagnosis not present

## 2022-01-13 DIAGNOSIS — E785 Hyperlipidemia, unspecified: Secondary | ICD-10-CM | POA: Diagnosis present

## 2022-01-13 DIAGNOSIS — Z79899 Other long term (current) drug therapy: Secondary | ICD-10-CM | POA: Diagnosis not present

## 2022-01-13 DIAGNOSIS — K253 Acute gastric ulcer without hemorrhage or perforation: Secondary | ICD-10-CM | POA: Diagnosis not present

## 2022-01-13 LAB — RESP PANEL BY RT-PCR (FLU A&B, COVID) ARPGX2
Influenza A by PCR: NEGATIVE
Influenza B by PCR: NEGATIVE
SARS Coronavirus 2 by RT PCR: NEGATIVE

## 2022-01-13 LAB — URINE CULTURE

## 2022-01-13 LAB — LAMOTRIGINE LEVEL: Lamotrigine Lvl: 2.1 ug/mL (ref 2.0–20.0)

## 2022-01-13 MED ORDER — LAMOTRIGINE 25 MG PO TABS: 25.0000 mg | ORAL_TABLET | Freq: Every day | ORAL | Status: AC

## 2022-01-13 MED ORDER — LAMOTRIGINE 25 MG PO TABS: 25.0000 mg | ORAL_TABLET | Freq: Every day | ORAL | Status: DC

## 2022-01-13 MED ORDER — PANTOPRAZOLE SODIUM 40 MG PO TBEC: DELAYED_RELEASE_TABLET | ORAL | 1 refills | Status: DC

## 2022-01-13 MED ORDER — CEFADROXIL 500 MG PO CAPS: 500.0000 mg | ORAL_CAPSULE | Freq: Two times a day (BID) | ORAL | 0 refills | Status: DC

## 2022-01-13 MED ORDER — FLUOXETINE HCL 20 MG PO CAPS: 20.0000 mg | ORAL_CAPSULE | Freq: Every day | ORAL | 3 refills | Status: DC

## 2022-01-13 MED ORDER — FLUOXETINE HCL 10 MG PO CAPS: 10.0000 mg | ORAL_CAPSULE | Freq: Every day | ORAL | Status: DC

## 2022-01-13 MED ORDER — FLUOXETINE HCL 20 MG PO CAPS: 20.0000 mg | ORAL_CAPSULE | Freq: Every day | ORAL | Status: DC

## 2022-01-13 NOTE — TOC Progression Note (Signed)
Transition of Care (TOC) - Progression Note  ? ? ?Patient Details  ?Name: Cynthia Tapia ?MRN: [id number] ?Date of Birth: 1965-11-23 ? ?Transition of Care (TOC) CM/SW Contact  ?Purcell Mouton, RN ?Phone Number: ?01/13/2022, 12:19 PM ? ?Clinical Narrative:    ?Pt was accepted at [facility]. Pt signed Voluntary Consent for Treatment. Consent was faxed to [place]. Pt will need to arrive by 2200 to [facility] room 403-1, call report to [phone]. Call  [place] [phone] or after 6 PM [phone] for transportation.  ? ? ?Expected Discharge Plan: [facility] ?Barriers to Discharge: Continued Medical Work up ? ?Expected Discharge Plan and Services ?Expected Discharge Plan: [facility] ?  ?  ?  ?Living arrangements for the past 2 months: [place] ?                ?  ?  ?  ?  ?  ?  ?  ?  ?  ?  ? ? ?Social Determinants of Health (SDOH) Interventions ?  ? ?Readmission Risk Interventions ?   ? View : No data to display.  ?  ?  ?  ? ? ?

## 2022-01-13 NOTE — Discharge Summary (Signed)
?Physician Discharge Summary ?  ?Patient: Cynthia Tapia MRN: [id number] DOB: Aug 18, 1966  ?Admit date:     01/10/2022  ?Discharge date: 01/13/22  ?Discharge Physician: Murray Hodgkins  ? ?PCP: Antony Contras, MD  ? ?Discharged to [facility] ? ?Recommendations at discharge:  ? ?Follow-up suicide attempt ?Follow-up dissociative disorder ?Follow-up gastric ulcers, esophagitis. Recommend repeat EGD in 2 to 3 months to document healing of esophagitis and gastric ulcers. Follow-up in GI clinic in 2 months after discharge. ?Follow-up resolution of UTI ?Follow-up blood pressure. Antihypertensives stopped given low normal blood pressure. Assess for need to resume. ? ?Discharge Diagnoses: ?Principal Problem: ?  Suicide attempt by multiple drug overdose ([place]) ?Active Problems: ?  Dissociative disorder ?  Esophagitis ?  Gastric ulcer ?  UTI ?  Anxiety ?  Depression ?  PTSD (post-traumatic stress disorder) ?  Intentional overdose ([place]) ? ?Resolved Problems: ?  Toxic encephalopathy ?  GIB (gastrointestinal bleeding) ?  Dehydration ? ?Hospital Course: ?56 year old woman PMH dissociative disorder, anxiety, depression, PTSD presented with suicide attempt with intentional overdose of  Klonopin, Toradol, and Tizanidine and other medications per psychiatry note.  NG tube in emergency department returned 1 L bloody gastric contents. ?--4/28 admit to ICU per PCCM, GI consult for EGD, psych consult w/ rec for inpt treatment; EGD LA grade B esophagitis, non-bleeding gastric ulcers ?--4/29 improved, off bicarb gtt ?--4/30 transferred to [facility] ?--5/1 medically clear for transfer to inpatient psychiatric facility ? ?* Suicide attempt by multiple drug overdose ([place]) ?--treated w/ bicarb gtt, QTc stable, transferred to [facility] 4/30 ?--medically stable ?--needs inpatient psychiatric treatment ?--cannot leave AMA -- if attempts to do so, must be IVC'd. ? ?GIB (gastrointestinal bleeding)-resolved as of 01/12/2022 ?--sp EGD; secondary to EGD  LA grade B esophagitis, non-bleeding gastric ulcers ?--plan as below for esophagitis ? ?Esophagitis ?--BID PPI IV while in hospital, then PO protonix '40mg'$  BID for 8 weeks, then '40mg'$  daily for 4 weeks ?---Recommend repeat EGD in 2 to 3 months to document healing of esophagitis and gastric ulcers. ?--Follow-up in GI clinic in 2 months after discharge. ? ?Gastric ulcer ?--PPI ? ?Toxic encephalopathy-resolved as of 01/12/2022 ?--secondary to multidrug overdose ?--resolved ? ?Dissociative disorder ?--continue management per psychiatry ?--psychiatry resumed lamotrigine, Prozac ? ?UTI ?--symptomatic, u/a grossly positive, urine culture unrevealing ?--empiric cephalosporin ? ?Dehydration-resolved as of 01/12/2022 ?--resolved; did not meet criteria for AKI ? ?Consultants:  ?Admitted by PCCM ?Gastroenterology ?Psychiatry ? ?Procedures performed:  ?EGD ?Impression:               - NG tube were found in the esophagus. Removal was  ?                          successful. ?                          - LA Grade B esophagitis with no bleeding. ?                          - Non-bleeding gastric ulcers with pigmented  ?                          material. Biopsied. ?                          - Normal duodenal bulb, first  portion of the  ?                          duodenum and second portion of the duodenum.  ?Disposition:  Transfer to [facility] for inpatient psychiatric treatment ?Diet recommendation:  ?Regular diet ?DISCHARGE MEDICATION: ?Allergies as of 01/13/2022   ? ?   Reactions  ? Latex Rash  ? ?  ? ?  ?Medication List  ?  ? ?STOP taking these medications   ? ?amLODipine 5 MG tablet ?Commonly known as: NORVASC ?  ?cloNIDine 0.1 MG tablet ?Commonly known as: CATAPRES ?  ?nitrofurantoin (macrocrystal-monohydrate) 100 MG capsule ?Commonly known as: MACROBID ?  ?olmesartan-hydrochlorothiazide 20-12.5 MG tablet ?Commonly known as: BENICAR HCT ?  ?tiZANidine 4 MG tablet ?Commonly known as: ZANAFLEX ?  ? ?  ? ?TAKE these  medications   ? ?Ajovy 225 MG/1.5ML Sosy ?Generic drug: Fremanezumab-vfrm ?Inject 225 mg into the skin every 28 (twenty-eight) days. ?  ?cefadroxil 500 MG capsule ?Commonly known as: DURICEF ?Take 1 capsule (500 mg total) by mouth every 12 (twelve) hours for 5 doses. ?  ?clonazePAM 2 MG tablet ?Commonly known as: KLONOPIN ?Take 2 mg by mouth daily as needed for anxiety. ?  ?FLUoxetine 20 MG capsule ?Commonly known as: PROZAC ?Take 1 capsule (20 mg total) by mouth daily. ?Start taking on: Jan 14, 2022 ?What changed:  ?how much to take ?when to take this ?  ?iron polysaccharides 150 MG capsule ?Commonly known as: NIFEREX ?Take 150 mg by mouth every morning. ?  ?lamoTRIgine 25 MG tablet ?Commonly known as: LAMICTAL ?Take 1 tablet (25 mg total) by mouth daily. ?Start taking on: Jan 14, 2022 ?What changed:  ?medication strength ?how much to take ?when to take this ?  ?pantoprazole 40 MG tablet ?Commonly known as: Protonix ?Take 1 tablet (40 mg total) by mouth 2 (two) times daily for 56 days, THEN 1 tablet (40 mg total) daily. ?Start taking on: Jan 13, 2022 ?  ?pravastatin 20 MG tablet ?Commonly known as: PRAVACHOL ?Take 20 mg by mouth every morning. ?  ?rizatriptan 10 MG tablet ?Commonly known as: MAXALT ?Take 10 mg by mouth See admin instructions. Take one tablet (10 mg) by mouth at onset of migraine headache, may repeat in 2 hours if needed ?  ?Vitamin D (Ergocalciferol) 1.25 MG (50000 UNIT) Caps capsule ?Commonly known as: DRISDOL ?Take 50,000 Units by mouth every 14 (fourteen) days. ?  ?zonisamide 100 MG capsule ?Commonly known as: ZONEGRAN ?Take 100 mg by mouth at bedtime. ?  ? ?  ? ? Follow-up Information   ? ? Gastroenterology, [facility]. Schedule an appointment as soon as possible for a visit in 2 month(s).   ?Why: Follow-up for esophagitis and gastric ulcers ?Contact information: ?[address] ?[address] ?[place] [address] ?[phone] ? ? ?  ?  ? ?  ?  ? ?  ? ?--RN note 4/30 > pt w/ panic attack, inconsolable.  Treated w/ haloperidol. ?--feels ok today; willing to go to inpatient psychiatric treatment ? ?Discharge Exam: ?Vitals:  ?  01/12/22 0700 01/12/22 1405 01/12/22 2215 01/13/22 0601  ?BP:   126/68 113/73 119/63  ?Pulse:   64 (!) 58 (!) 46  ?Resp:     18 18  ?Temp:   99 ?F (37.2 ?C) 97.9 ?F (36.6 ?C) 97.9 ?F (36.6 ?C)  ?TempSrc:   Oral Oral Oral  ?SpO2:   94% 94% 95%  ?Weight: 101.3  kg        ?Height:          ?AF >24 hours ?VSS ?Level 5 ?Physical Exam ?Vitals reviewed.  ?Constitutional:   ?   General: She is not in acute distress. ?   Appearance: She is not ill-appearing or toxic-appearing.  ?Cardiovascular:  ?   Rate and Rhythm: Normal rate and regular rhythm.  ?   Heart sounds: No murmur heard. ?Pulmonary:  ?   Effort: Pulmonary effort is normal. No respiratory distress.  ?   Breath sounds: No wheezing, rhonchi or rales.  ?Neurological:  ?   Mental Status: She is alert.  ?Psychiatric:     ?   Mood and Affect: Mood normal.     ?   Behavior: Behavior normal.  ? ?Condition at discharge: good ? ?The results of significant diagnostics from this hospitalization (including imaging, microbiology, ancillary and laboratory) are listed below for reference.  ? ?Imaging Studies: ?DG Abdomen 1 View ? ?Result Date: 01/10/2022 ?CLINICAL DATA:  Overdose. EXAM: ABDOMEN - 1 VIEW; PORTABLE CHEST - 1 VIEW COMPARISON:  07/30/2006. FINDINGS: The heart is normal in size and the mediastinal contour are within normal limits. Lung volumes are low. No consolidation, effusion, or pneumothorax. No acute osseous abnormality. The bowel gas pattern is normal. An enteric tube terminates in the stomach. Surgical clips are present in the right upper quadrant and right lower quadrant. No radio-opaque calculi. Mild degenerative changes are noted in the lumbar spine. IMPRESSION: 1. Low lung volumes with no acute cardiopulmonary process. 2. No evidence of bowel obstruction. 3. An enteric tube terminates in the stomach. Electronically Signed   By: Brett Fairy M.D.   On: 01/10/2022 01:44  ? ?DG Chest Port 1 View ? ?Result Date: 01/10/2022 ?CLINICAL DATA:  Overdose. EXAM: ABDOMEN - 1 VIEW; PORTABLE CHEST - 1 VIEW COMPARISON:  07/30/2006. FINDINGS: The heart is

## 2022-01-13 NOTE — Progress Notes (Deleted)
?  Progress Note ? ? ?Patient: Cynthia Tapia JGO:[id number] DOB: Nov 04, 1965 DOA: 01/10/2022     3 ?DOS: the patient was seen and examined on 01/13/2022 ?  ?Brief hospital course: ?56 year old woman PMH dissociative disorder, anxiety, depression, PTSD presented with suicide attempt with intentional overdose of '60mg'$  Klonopin, '80mg'$  Toradol, and '24mg'$  Tizanidine.  NG tube in emergency department returned 1 L bloody gastric contents. ?--4/28 admit to ICU per PCCM, GI consult for EGD, psych consult w/ rec for inpt treatment; EGD LA grade B esophagitis, non-bleeding gastric ulcers ?--4/29 improved, off bicarb gtt ?--4/30 transferred to [facility] ?--5/1 medically clear for transfer to inpatient psychiatric facility ? ?Assessment and Plan: ?* Suicide attempt by multiple drug overdose ([place]) ?--treated w/ bicarb gtt, QTc stable, transferred to [facility] 4/30 ?--medically stable ?--needs inpatient psychiatric treatment ?--cannot leave AMA -- if attempts to do so, must be IVC'd. ? ?GIB (gastrointestinal bleeding)-resolved as of 01/12/2022 ?--sp EGD; secondary to EGD LA grade B esophagitis, non-bleeding gastric ulcers ?--plan as below ? ?Toxic encephalopathy-resolved as of 01/12/2022 ?--secondary to multidrug overdose ?--resolved ? ?Dissociative disorder ?--continue management per psychiatry ? ?Gastric ulcer ?--PPI ? ?Esophagitis ?--BID PPI IV while in hospital, then PO protonix '40mg'$  BID for 8 weeks, then '40mg'$  daily for 4 weeks ? ?UTI ?--symptomatic, u/a grossly positive, culture pending ?--empiric cephalosporin ? ?Dehydration-resolved as of 01/12/2022 ?--resolved; did not meet criteria for AKI ? ? ? ? ?  ? ?Subjective:  ?--RN note 4/30 > pt w/ panic attack, inconsolable. Treated w/ haloperidol. ?--feels ok today; willing to go to inpatient psychiatric treatment ? ?Physical Exam: ?Vitals:  ? 01/12/22 0700 01/12/22 1405 01/12/22 2215 01/13/22 0601  ?BP:  126/68 113/73 119/63  ?Pulse:  64 (!) 58 (!) 46  ?Resp:   18 18  ?Temp:  99 ?F (37.2 ?C) 97.9 ?F  (36.6 ?C) 97.9 ?F (36.6 ?C)  ?TempSrc:  Oral Oral Oral  ?SpO2:  94% 94% 95%  ?Weight: 101.3 kg     ?Height:      ?AF >24 hours ?VSS ?Level 5 ?Physical Exam ?Vitals reviewed.  ?Constitutional:   ?   General: She is not in acute distress. ?   Appearance: She is not ill-appearing or toxic-appearing.  ?Cardiovascular:  ?   Rate and Rhythm: Normal rate and regular rhythm.  ?   Heart sounds: No murmur heard. ?Pulmonary:  ?   Effort: Pulmonary effort is normal. No respiratory distress.  ?   Breath sounds: No wheezing, rhonchi or rales.  ?Neurological:  ?   Mental Status: She is alert.  ?Psychiatric:     ?   Mood and Affect: Mood normal.     ?   Behavior: Behavior normal.  ? ? ? ?Data Reviewed: ? ?Urinalysis positive; urine culture pending ? ?Family Communication: husband at bedside ? ?Disposition: ?Status is: Inpatient ?Remains inpatient appropriate because: awaiting inpatient psychiatric transfer ? Planned Discharge Destination:  Inpatient psychiatric facility ? ? ? ?Time spent: 20 minutes ? ?Author: ?Murray Hodgkins, MD ?01/13/2022 8:16 AM ? ?For on call review [email or web address].  ?

## 2022-01-13 NOTE — Plan of Care (Signed)

## 2022-01-13 NOTE — Consult Note (Signed)
[facility] Face-to-Face Psychiatry Consult  ? ?Reason for Consult:   ?Referring Physician:   ?Patient Identification: Cynthia Tapia ?MRN:  [id number] ?Principal Diagnosis: Suicide attempt by multiple drug overdose ([place]) ?Diagnosis:  Principal Problem: ?  Suicide attempt by multiple drug overdose ([place]) ?Active Problems: ?  Dissociative disorder ?  Anxiety ?  Depression ?  PTSD (post-traumatic stress disorder) ?  Esophagitis ?  Gastric ulcer ?  UTI ? ? ?Total Time spent with patient: 30 minutes ? ?Subjective:   ?Cynthia Tapia is a 56 y.o. female patient admitted with suicide attempt by overdose.  Patient presented after intentional overdose on medications, and intent to end her life.  Patient states she took all of her phenobarbital, Toradol, 21 Klonopin, all of her tizanidine, all of her rizatriptan, and drank a mini bottle of champagne.  Some of the medications listed above are different from initial intake, in which she reported taking tizanidine, Toradol, Klonopin,zonegran, and lamictal.   ? ?On todays evaluation, patient is observed to be lying on the couch in her room, however sits up appropriately to engage with this Probation officer. She describes her mood as "ok". Her husband is present and also engages in this reassessment. Patient appears to be open to seeking inpatient psychiatric treatment. She is forthcoming with information and correlates this to her previous experiences inside psychiatric hospital. We reviewed course of expectations, goals of care, and length of stay. She reports she is sleeping and eating well at this time.  She continues to show regret for her actions, and looking forward to her next step. She understands that active participation in treatment and therapeutic mileu will help with improved outcomes and decrease in length of stay. She remains hopeful that her therapist will be available to participate and or visit, discussed visitor policies. Support, really encouragement, reassurance were offered.   Patient denies suicidal ideations, however she continues to meet inpatient psychiatric criteria at this time related to her suicide attempt as listed above.  ? ? ?HPI:  56 yo female with pmh significant for dissociative disorder, anxiety, depression, ptsd, hyperlipidemia, htn, migraines who presented after intentional overdose of medications at home as she wanted to end her life. She was reportedly in normal state of health (with exception of recent uti that she just finished abx for) until this afternoon. Per family and friend report pt was at her baseline this morning, pleasant without issues. Later in the day she had a therapy session and she states it was "bad" and since then she has been "spiraling".  ?  ?Pt's husband called her therapist numerous times over the course of the day for assistance with helping pt cope. Unfortunately, she continued to feel worse and wanted to just "knock out and never wake up". She reportedly took approximately '60mg'$  klonopin, 80 mg toradol and '24mg'$  tizanidine.  ?  ?She does endorse having suicidal thoughts before and even as far as a plan but has never acted on it until this incident.  ?  ?When pt arrived she was altered but arousable. Ng was placed for activated charcoal but when tube was placed >1L of bloody gastric contents were removed. She reports that she has had black vomit and stool for some time now. She has a history of upper endoscopy for gastritis but never found any ulcerations.  ? ?Past Psychiatric History: See above ? ?Risk to Self: Yes ?Risk to Others: Denies ?Prior Inpatient Therapy: [facility] ?[facility] Out[facility] ? ?Past Medical History:  ?  Past Medical History:  ?Diagnosis Date  ? Anxiety   ? GERD (gastroesophageal reflux disease)   ? History of hiatal hernia   ? Hyperlipidemia   ? Hypertension   ? Migraine   ?  ?Past Surgical History:  ?Procedure Laterality Date  ? BIOPSY  01/10/2022  ? Procedure: BIOPSY;  Surgeon: Otis Brace,  MD;  Location: [facility];  Service: Gastroenterology;;  ? CESAREAN SECTION    ? x 2  ? CHOLECYSTECTOMY N/A 06/23/2018  ? Procedure: LAPAROSCOPIC CHOLECYSTECTOMY;  Surgeon: Erroll Luna, MD;  Location: [facility];  Service: General;  Laterality: N/A;  ? ESOPHAGOGASTRODUODENOSCOPY    ? ESOPHAGOGASTRODUODENOSCOPY N/A 01/10/2022  ? Procedure: ESOPHAGOGASTRODUODENOSCOPY (EGD);  Surgeon: Otis Brace, MD;  Location: [facility];  Service: Gastroenterology;  Laterality: N/A;  ? LAPAROSCOPIC HYSTERECTOMY    ? ?Family History:  ?Family History  ?Problem Relation Age of Onset  ? Migraines Mother   ? ?Family Psychiatric  History: Significant family history see above ?Social History:  ?Social History  ? ?Substance and Sexual Activity  ?Alcohol Use Yes  ? Comment: occ  ?   ?Social History  ? ?Substance and Sexual Activity  ?Drug Use No  ?  ?Social History  ? ?Socioeconomic History  ? Marital status: Married  ?  Spouse name: Not on file  ? Number of children: Not on file  ? Years of education: Not on file  ? Highest education level: Not on file  ?Occupational History  ? Not on file  ?Tobacco Use  ? Smoking status: Never  ? Smokeless tobacco: Never  ?Vaping Use  ? Vaping Use: Never used  ?Substance and Sexual Activity  ? Alcohol use: Yes  ?  Comment: occ  ? Drug use: No  ? Sexual activity: Not on file  ?Other Topics Concern  ? Not on file  ?Social History Narrative  ? Not on file  ? ?Social Determinants of Health  ? ?Financial Resource Strain: Not on file  ?Food Insecurity: Not on file  ?Transportation Needs: Not on file  ?Physical Activity: Not on file  ?Stress: Not on file  ?Social Connections: Not on file  ? ?Additional Social History: ?  ? ?Allergies:   ?Allergies  ?Allergen Reactions  ? Latex Rash  ? ? ?Labs:  ?Results for orders placed or performed during the hospital encounter of 01/10/22 (from the past 48 hour(s))  ?Urinalysis, Routine w reflex microscopic     Status: Abnormal  ? Collection Time: 01/12/22 10:13 AM   ?Result Value Ref Range  ? Color, Urine RED (A) YELLOW  ?  Comment: BIOCHEMICALS MAY BE AFFECTED BY COLOR  ? APPearance TURBID (A) CLEAR  ? Specific Gravity, Urine 1.010 1.005 - 1.030  ? pH 7.0 5.0 - 8.0  ? Glucose, UA 100 (A) NEGATIVE mg/dL  ? Hgb urine dipstick LARGE (A) NEGATIVE  ? Bilirubin Urine LARGE (A) NEGATIVE  ? Ketones, ur 40 (A) NEGATIVE mg/dL  ? Protein, ur >300 (A) NEGATIVE mg/dL  ? Nitrite POSITIVE (A) NEGATIVE  ? Leukocytes,Ua LARGE (A) NEGATIVE  ?  Comment: Performed at [facility], [place] [address], [place], [place] [address]  ?Urinalysis, Microscopic (reflex)     Status: Abnormal  ? Collection Time: 01/12/22 10:13 AM  ?Result Value Ref Range  ? RBC / HPF >50 0 - 5 RBC/hpf  ? WBC, UA 6-10 0 - 5 WBC/hpf  ? Bacteria, UA FEW (A) NONE SEEN  ? Squamous Epithelial / LPF 0-5 0 - 5  ?  Comment: Performed at [facility], [place] [address] [place], [place] [address]  ? ? ?Current Facility-Administered Medications  ?Medication Dose Route Frequency Provider Last Rate Last Admin  ? acetaminophen (TYLENOL) tablet 650 mg  650 mg Per Tube Q4H PRN Audria Nine, DO   650 mg at 01/11/22 1204  ? cefadroxil (DURICEF) capsule 500 mg  500 mg Oral Q12H Samuella Cota, MD   500 mg at 01/13/22 3734  ? clonazePAM (KLONOPIN) tablet 2 mg  2 mg Oral Daily PRN Freddi Starr, MD   2 mg at 01/12/22 1023  ? docusate (COLACE) 50 MG/5ML liquid 100 mg  100 mg Per Tube BID PRN Audria Nine, DO      ? FLUoxetine (PROZAC) capsule 60 mg  60 mg Oral q morning Freddi Starr, MD   60 mg at 01/13/22 0931  ? guaiFENesin (ROBITUSSIN) 100 MG/5ML liquid 5 mL  5 mL Oral Q4H PRN Freddi Starr, MD   5 mL at 01/11/22 1013  ? haloperidol lactate (HALDOL) injection 5 mg  5 mg Intravenous Q6H PRN Gerald Leitz D, NP   5 mg at 01/12/22 1855  ? iron polysaccharides (NIFEREX) capsule 150 mg  150 mg Oral q morning Freddi Starr, MD   150 mg at 01/13/22 2876  ? lamoTRIgine (LAMICTAL)  tablet 100 mg  100 mg Oral QHS Freddi Starr, MD   100 mg at 01/13/22 0931  ? MEDLINE mouth rinse  15 mL Mouth Rinse BID Freddi Starr, MD   15 mL at 01/13/22 8115  ? naphazoline-glycerin (CLEAR EYES

## 2022-01-14 ENCOUNTER — Encounter (HOSPITAL_COMMUNITY): Payer: Self-pay | Admitting: Emergency Medicine

## 2022-01-14 ENCOUNTER — Other Ambulatory Visit: Payer: Self-pay

## 2022-01-14 DIAGNOSIS — Z1211 Encounter for screening for malignant neoplasm of colon: Secondary | ICD-10-CM | POA: Insufficient documentation

## 2022-01-14 DIAGNOSIS — D509 Iron deficiency anemia, unspecified: Secondary | ICD-10-CM | POA: Insufficient documentation

## 2022-01-14 DIAGNOSIS — K219 Gastro-esophageal reflux disease without esophagitis: Secondary | ICD-10-CM | POA: Insufficient documentation

## 2022-01-14 DIAGNOSIS — Z6833 Body mass index (BMI) 33.0-33.9, adult: Secondary | ICD-10-CM | POA: Insufficient documentation

## 2022-01-14 DIAGNOSIS — E876 Hypokalemia: Secondary | ICD-10-CM | POA: Insufficient documentation

## 2022-01-14 DIAGNOSIS — M549 Dorsalgia, unspecified: Secondary | ICD-10-CM | POA: Insufficient documentation

## 2022-01-14 DIAGNOSIS — F430 Acute stress reaction: Principal | ICD-10-CM | POA: Diagnosis present

## 2022-01-14 DIAGNOSIS — T50902A Poisoning by unspecified drugs, medicaments and biological substances, intentional self-harm, initial encounter: Secondary | ICD-10-CM | POA: Diagnosis present

## 2022-01-14 DIAGNOSIS — R011 Cardiac murmur, unspecified: Secondary | ICD-10-CM | POA: Insufficient documentation

## 2022-01-14 DIAGNOSIS — E7800 Pure hypercholesterolemia, unspecified: Secondary | ICD-10-CM | POA: Insufficient documentation

## 2022-01-14 DIAGNOSIS — K449 Diaphragmatic hernia without obstruction or gangrene: Secondary | ICD-10-CM | POA: Insufficient documentation

## 2022-01-14 DIAGNOSIS — R1011 Right upper quadrant pain: Secondary | ICD-10-CM | POA: Insufficient documentation

## 2022-01-14 DIAGNOSIS — K2270 Barrett's esophagus without dysplasia: Secondary | ICD-10-CM | POA: Insufficient documentation

## 2022-01-14 DIAGNOSIS — F3340 Major depressive disorder, recurrent, in remission, unspecified: Secondary | ICD-10-CM | POA: Insufficient documentation

## 2022-01-14 DIAGNOSIS — G43009 Migraine without aura, not intractable, without status migrainosus: Secondary | ICD-10-CM | POA: Insufficient documentation

## 2022-01-14 DIAGNOSIS — R112 Nausea with vomiting, unspecified: Secondary | ICD-10-CM | POA: Insufficient documentation

## 2022-01-14 DIAGNOSIS — E559 Vitamin D deficiency, unspecified: Secondary | ICD-10-CM | POA: Insufficient documentation

## 2022-01-14 DIAGNOSIS — E781 Pure hyperglyceridemia: Secondary | ICD-10-CM | POA: Insufficient documentation

## 2022-01-14 LAB — TSH: TSH: 0.67 u[IU]/mL (ref 0.350–4.500)

## 2022-01-14 LAB — HEMOGLOBIN A1C
Hgb A1c MFr Bld: 5.5 % (ref 4.8–5.6)
Mean Plasma Glucose: 111.15 mg/dL

## 2022-01-14 LAB — LIPID PANEL
Cholesterol: 204 mg/dL — ABNORMAL HIGH (ref 0–200)
HDL: 49 mg/dL (ref >40)
LDL Cholesterol: 111 mg/dL — ABNORMAL HIGH (ref 0–99)
Total CHOL/HDL Ratio: 4.2 RATIO
Triglycerides: 221 mg/dL — ABNORMAL HIGH (ref <150)
VLDL: 44 mg/dL — ABNORMAL HIGH (ref 0–40)

## 2022-01-14 MED ORDER — DOCUSATE SODIUM 50 MG/5ML PO LIQD
100.0000 mg | Freq: Two times a day (BID) | ORAL | Status: DC | PRN
  Filled 2022-01-14: qty 10

## 2022-01-14 MED ORDER — PANTOPRAZOLE SODIUM 40 MG PO TBEC
40.0000 mg | DELAYED_RELEASE_TABLET | Freq: Two times a day (BID) | ORAL | Status: DC
Start: 2022-01-14 — End: 2022-01-17
  Administered 2022-01-14 – 2022-01-17 (×6): 40 mg via ORAL
  Filled 2022-01-14 (×8): qty 1

## 2022-01-14 MED ORDER — HYDROCHLOROTHIAZIDE 12.5 MG PO TABS
12.5000 mg | ORAL_TABLET | Freq: Every day | ORAL | Status: DC
  Administered 2022-01-15 – 2022-01-17 (×3): 12.5 mg via ORAL
  Filled 2022-01-14 (×4): qty 1

## 2022-01-14 MED ORDER — ACETAMINOPHEN 325 MG PO TABS: 650.0000 mg | ORAL_TABLET | ORAL | Status: DC | PRN

## 2022-01-14 MED ORDER — FLUOXETINE HCL 20 MG PO CAPS: 20.0000 mg | ORAL_CAPSULE | Freq: Every day | ORAL | Status: DC

## 2022-01-14 MED ORDER — FLUOXETINE HCL 20 MG PO CAPS
20.0000 mg | ORAL_CAPSULE | Freq: Every day | ORAL | Status: AC
  Administered 2022-01-14: 20 mg via ORAL
  Filled 2022-01-14: qty 1

## 2022-01-14 MED ORDER — ALUM & MAG HYDROXIDE-SIMETH 200-200-20 MG/5ML PO SUSP: 30.0000 mL | ORAL | Status: DC | PRN

## 2022-01-14 MED ORDER — DOCUSATE SODIUM 100 MG PO CAPS: 100.0000 mg | ORAL_CAPSULE | Freq: Two times a day (BID) | ORAL | Status: DC | PRN

## 2022-01-14 MED ORDER — CLONIDINE HCL 0.2 MG PO TABS
0.2000 mg | ORAL_TABLET | Freq: Every day | ORAL | Status: DC
Start: 2022-01-14 — End: 2022-01-17
  Administered 2022-01-14 – 2022-01-16 (×3): 0.2 mg via ORAL
  Filled 2022-01-14 (×4): qty 1

## 2022-01-14 MED ORDER — LAMOTRIGINE 25 MG PO TABS
25.0000 mg | ORAL_TABLET | Freq: Every day | ORAL | Status: DC
  Administered 2022-01-14 – 2022-01-17 (×4): 25 mg via ORAL
  Filled 2022-01-14 (×5): qty 1

## 2022-01-14 MED ORDER — FLUOXETINE HCL 20 MG PO CAPS
20.0000 mg | ORAL_CAPSULE | Freq: Every day | ORAL | Status: DC
  Administered 2022-01-15: 20 mg via ORAL
  Filled 2022-01-14 (×3): qty 1

## 2022-01-14 MED ORDER — IRBESARTAN 150 MG PO TABS
150.0000 mg | ORAL_TABLET | Freq: Every day | ORAL | Status: DC
Start: 2022-01-15 — End: 2022-01-17
  Administered 2022-01-15 – 2022-01-17 (×3): 150 mg via ORAL
  Filled 2022-01-14 (×4): qty 1

## 2022-01-14 MED ORDER — CEFADROXIL 500 MG PO CAPS
500.0000 mg | ORAL_CAPSULE | Freq: Two times a day (BID) | ORAL | Status: DC
  Administered 2022-01-14 – 2022-01-16 (×6): 500 mg via ORAL
  Filled 2022-01-14 (×10): qty 1

## 2022-01-14 MED ORDER — MAGNESIUM HYDROXIDE 400 MG/5ML PO SUSP: 30.0000 mL | Freq: Every day | ORAL | Status: DC | PRN

## 2022-01-14 MED ORDER — ZONISAMIDE 100 MG PO CAPS
100.0000 mg | ORAL_CAPSULE | Freq: Every day | ORAL | Status: DC
  Administered 2022-01-14 – 2022-01-16 (×3): 100 mg via ORAL
  Filled 2022-01-14 (×4): qty 1

## 2022-01-14 MED ORDER — CLONAZEPAM 1 MG PO TABS
2.0000 mg | ORAL_TABLET | Freq: Every day | ORAL | Status: DC | PRN
Start: 2022-01-14 — End: 2022-01-17

## 2022-01-14 MED ORDER — AMLODIPINE BESYLATE 5 MG PO TABS
5.0000 mg | ORAL_TABLET | Freq: Every day | ORAL | Status: DC
  Administered 2022-01-14 – 2022-01-17 (×4): 5 mg via ORAL
  Filled 2022-01-14 (×5): qty 1

## 2022-01-14 MED ORDER — PRAVASTATIN SODIUM 20 MG PO TABS
20.0000 mg | ORAL_TABLET | Freq: Every morning | ORAL | Status: DC
  Administered 2022-01-14 – 2022-01-17 (×4): 20 mg via ORAL
  Filled 2022-01-14 (×5): qty 1

## 2022-01-14 NOTE — Tx Team (Signed)
Initial Treatment Plan ?01/14/2022 ?2:39 AM ?Kela Millin ?WTU:[id number] ? ? ? ?PATIENT STRESSORS: ?Health problems   ?Other: difficulty with past    ? ? ?PATIENT STRENGTHS: ?Ability for insight  ?Average or above average intelligence  ?Capable of independent living  ?General fund of knowledge  ?Motivation for treatment/growth  ?Supportive family/friends  ? ? ?PATIENT IDENTIFIED PROBLEMS: ?  ?depression  ?anxiety  ?Suicide attempt  ?Atypical migraine  ?"Not to kill myself"  ?  ?  ?  ?  ? ?DISCHARGE CRITERIA:  ?Adequate post-discharge living arrangements ?Improved stabilization in mood, thinking, and/or behavior ?Need for constant or close observation no longer present ? ?PRELIMINARY DISCHARGE PLAN: ?Outpatient therapy ?Return to previous living arrangement ?Return to previous work or school arrangements ? ?PATIENT/FAMILY INVOLVEMENT: ?This treatment plan has been presented to and reviewed with the patient, DARIN REDMANN.  The patient and family have been given the opportunity to ask questions and make suggestions. ? ?JEHU-APPIAH, Megan Salon, RN ?01/14/2022, 2:39 AM ?
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
## 2022-01-14 NOTE — Progress Notes (Signed)
Pt presents with anxious mood, affect congruent. Amy reports she is '' doing okay'' states she came in last night and was feeling a little tired. Patient states she is currently not having any suicidal ideation, and states '' I am thankful I am here. '' Discussed trigger for suicide attempt and patient states '' I had a therapy session that just went too deep, into a lot of stuff that just really had me feeling suicidal. I acted impulsively and I went home and took the overdose. '' Patient later reported the therapist was aware she was '' not doing well because he called my husband to check on me.'' Patient did express medication related concerns which were addressed with the treatment team. She has been visible on the unit attending programming, meals, and in no acute distress. Pt denies any SI or HI or A/V Hallucinations at this time. Pt is safe, will con't to monitor.  ?

## 2022-01-14 NOTE — Progress Notes (Signed)
Adult Psychoeducational Group Note ? ?Date:  01/14/2022 ?Time:  9:47 PM ? ?Group Topic/Focus:  ?Wrap-Up Group:   The focus of this group is to help patients review their daily goal of treatment and discuss progress on daily workbooks. ? ?Participation Level:  Active ? ?Participation Quality:  Appropriate ? ?Affect:  Appropriate ? ?Cognitive:  Appropriate ? ?Insight: Appropriate ? ?Engagement in Group:  Engaged ? ?Modes of Intervention:  Education and Exploration ? ?Additional Comments:  Patient attended and participated in group tonight. She reports that today she learnt that there are friendly piople here and she felt welcome ? ?Salley Scarlet Latham ?01/14/2022, 9:47 PM ?
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
## 2022-01-14 NOTE — BHH Group Notes (Signed)
Adult Psychoeducational Group Note ? ?Date:  01/14/2022 ?Time:  10:16 AM ? ?Group Topic/Focus:  ?Orientation:   The focus of this group is to educate the patient on the purpose and policies of crisis stabilization and provide a format to answer questions about their admission.  The group details unit policies and expectations of patients while admitted. ? ?Participation Level:  Active ? ?Participation Quality:  Appropriate ? ?Affect:  Appropriate ? ?Cognitive:  Appropriate ? ?Insight: Appropriate ? ?Engagement in Group:  Engaged ? ?Modes of Intervention:  Education ? ?Additional Comments:  Pt participated in group.  ? ?Kern Reap ?01/14/2022, 10:16 AM ?

## 2022-01-14 NOTE — BHH Suicide Risk Assessment (Signed)
Suicide Risk Assessment ? ?Admission Assessment    ?[facility] Admission Suicide Risk Assessment ? ? ?Nursing information obtained from:  Patient ?Demographic factors:  Caucasian ?Current Mental Status:  Suicidal ideation indicated by patient, Suicide plan, Intention to act on suicide plan, Belief that plan would result in death ?Loss Factors:  Decline in physical health ?Historical Factors:  Family history of mental illness or substance abuse, Impulsivity ?Risk Reduction Factors:  Sense of responsibility to family, Employed, Positive social support, Positive therapeutic relationship ? ?Total Time spent with patient: 45 minutes ?Principal Problem: Intentional overdose of drug in tablet form ([place]) ?Diagnosis:  Principal Problem: ?  Intentional overdose of drug in tablet form ([place]) ? ?Subjective Data:  ?"Cynthia" Tapia is a 56 year old female with PPHx PTSD and PMHx of atypical migraines, HTN, and HLD who presented to [facility] via EMS on 4/28 after a suicide attempt via ingestion of lonopin 42-60 mg, trazedone 1500 mg, 80 mg of Toradol, unknown quantity of zonegran and lamictal after a distressing therapy session. This is Cynthia's first inpatient psychiatric admission.  ?  ?While at [facility], patient was admitted to the ICU for bicarb gtt and found to have a GIB in which NG was placed for activated charcoal, at which time >1L of bloody gastric contents were removed. EGD was completed revealing the dx of esophagitis and non-bleeding gastric ulcers.  ? ?Continued Clinical Symptoms:  ?Alcohol Use Disorder Identification Test Final Score (AUDIT): 0 ?The "Alcohol Use Disorders Identification Test", Guidelines for Use in Primary Care, Second Edition.  World Pharmacologist [facility]). ?Score between 0-7:  no or low risk or alcohol related problems. ?Score between 8-15:  moderate risk of alcohol related problems. ?Score between 16-19:  high risk of alcohol related problems. ?Score 20 or above:  warrants further diagnostic evaluation for alcohol  dependence and treatment. ? ? ?CLINICAL FACTORS:  ? Severe Anxiety and/or Agitation ?Medical Diagnoses and Treatments/Surgeries ? ? ?Musculoskeletal: ?Strength & Muscle Tone: within normal limits ?Gait & Station: normal ?Patient leans: N/A ? ?Psychiatric Specialty Exam: ? ?Presentation  ?General Appearance: Appropriate for Environment; Casual ? ?Eye Contact:Good ? ?Speech:Clear and Coherent; Normal Rate ? ?Speech Volume:Normal ? ?Handedness:Right ? ? ?Mood and Affect  ?Mood:Depressed ? ?Affect:Congruent; Tearful; Full Range ? ? ?Thought Process  ?Thought Processes:Coherent; Linear ? ?Descriptions of Associations:Intact ? ?Orientation:Full (Time, Place and Person) ? ?Thought Content:Logical ? ?History of Schizophrenia/Schizoaffective disorder:No ? ?Duration of Psychotic Symptoms:N/A ? ?Hallucinations:Hallucinations: None ? ?Ideas of Reference:None ? ?Suicidal Thoughts:Suicidal Thoughts: No ? ?Homicidal Thoughts:Homicidal Thoughts: No ? ? ?Sensorium  ?Memory:Immediate Good; Recent Good ? ?Judgment:Fair ? ?Insight:Fair ? ? ?Executive Functions  ?Concentration:Good ? ?Attention Span:Good ? ?Recall:Good ? ?Fund of [place] ? ?Language:Good ? ? ?Psychomotor Activity  ?Psychomotor Activity:Psychomotor Activity: Normal ? ? ?Assets  ?Assets:Communication Skills; Desire for Improvement; Financial Resources/Insurance; Housing; Intimacy; Leisure Time; Physical Health; Resilience; Social Support ? ? ?Sleep  ?Sleep:Sleep: Fair ? ? ? ?Physical Exam: ?Physical Exam See H&P ?ROS See H&P ?Blood pressure (!) 146/93, pulse 69, temperature 97.6 ?F (36.4 ?C), resp. rate 18, height '5\' 8"'$  (1.727 m), weight 101.2 kg, SpO2 97 %. Body mass index is 33.91 kg/m?. ? ? ?COGNITIVE FEATURES THAT CONTRIBUTE TO RISK:  ?Loss of executive function   ? ?SUICIDE RISK:  ? Mild:  Suicidal ideation of limited frequency, intensity, duration, and specificity.  There are no identifiable plans, no associated intent, mild dysphoria and related symptoms,  good self-control (both objective and subjective assessment), few other risk factors, and identifiable protective  factors, including available and accessible social support. ? ?PLAN OF CARE:  ?Treatment Plan Summary: ?Daily contact with patient to assess and evaluate symptoms and progress in treatment and Medication management ? ?Observation Level/Precautions:  15 minute checks  ?Laboratory:  CBC ?Chemistry Profile ?HbAIC ?UDS ?UA ?TSH ?Lipid Panel  ?Psychotherapy:  As below  ?Medications:  As below  ?Consultations:  N/A; GI prior  ?Discharge Concerns:  Clinical Stabilization  ?Estimated LOS: 3-5 Days  ?Other:  N/A  ? ?Safety and Monitoring: ?voluntarily admission to inpatient psychiatric unit for safety, stabilization and treatment ?Daily contact with patient to assess and evaluate symptoms and progress in treatment ?Patient's case to be discussed in multi-disciplinary team meeting ?Observation Level : q15 minute checks ?Vital signs: q12 hours ?Precautions: suicide, elopement, and assault ? ?2. Psychiatric Problems ?#Intentional Overdose of Drug 2/2 Acute Stress Reaction ?#PTSD ?-Continue home medications at decreased dosages: Lamictal 25 mg and Prozac 20 mg daily ?-Restart home Clonidine 0.2 mg qHS for nightmares ?-Resume home Clonazepam 2 mg daily PRN anxiety ?-Advised to participate in the therapeutic milieu ? ?I certify that inpatient services furnished can reasonably be expected to improve the patient's condition.  ? ?Rosezetta Schlatter, MD ?01/14/2022, 3:55 PM ?

## 2022-01-14 NOTE — Group Note (Signed)
Recreation Therapy Group Note ? ? ?Group Topic:Animal Assisted Therapy   ?Group Date: 01/14/2022 ?Start Time: 1430 ?End Time: 7614 ?Facilitators: Victorino Sparrow, LRT,CTRS ?Location: [place] ? ? ?Animal-Assisted Activity (AAA) Program Checklist/Progress Note ?Patient Eligibility Criteria Checklist & Daily Group note for Rec Tx Intervention ? ?AAA/T Program Assumption of Risk Form signed by Patient/ or Parent Legal Guardian YES ? ?Patient is free of allergies or severe asthma  YES ? ?Patient reports no fear of animals YES ? ?Patient reports no history of cruelty to animals YES ? ?Patient understands their participation is voluntary YES ? ?Patient washes hands before animal contact YES ? ?Patient washes hands after animal contact YES ? ? ?Group Description: Patients provided opportunity to interact with trained and credentialed [facility] Therapy dog and the community volunteer/dog handler. Patients practiced appropriate animal interaction and were educated on dog safety outside of the hospital in common community settings. Patients were allowed to use dog toys and other items to practice commands, engage the dog in play, and/or complete routine aspects of animal care.  ? ?Education: Contractor, Pensions consultant, Communication & Social Skills  ? ? ?Affect/Mood: Appropriate ?  ?Participation Level: Engaged ?  ? ?Clinical Observations/Individualized Feedback:  Pt attended and participated.  Pt shared stories of their pets with therapy dog team as well as asked questions of the therapy dog team. ? ? ?Plan: Continue to engage patient in RT group sessions 2-3x/week. ? ? ?Victorino Sparrow, LRT,CTRS ?01/14/2022 4:05 PM ?

## 2022-01-14 NOTE — Progress Notes (Signed)
Patient ID: Cynthia Tapia, female   DOB: 08/06/1966, 56 y.o.   MRN: [id number] ?Admission note: ?Patient is a voluntary admission in no acute distress for intentional overdose on her prescribed medications. pt reports she [facility] and during her therapy session on 01/09/22 she uncovered a difficult past. (Pt is unwilling to elaborate). Pt reports it made her suicidal she went home and overdosed on her medication. Pt reports hx of atypical migraines in which her speech becomes slur and has an aura. ? Pt is endorsing no past suicidal attempt by self harm. ? Pt admitted to unit per protocol, skin assessment and belonging search done. No skin issues noted. Consent signed by pt. Pt educated on therapeutic milieu rules. Pt was introduced to milieu by nursing staff. Fall risk / suicide safety plan explained to the patient. 15 minutes checks started for safety. ? ?  ?

## 2022-01-15 ENCOUNTER — Encounter (HOSPITAL_COMMUNITY): Payer: Self-pay

## 2022-01-15 LAB — SURGICAL PATHOLOGY

## 2022-01-15 MED ORDER — FLUOXETINE HCL 20 MG PO CAPS
40.0000 mg | ORAL_CAPSULE | Freq: Every day | ORAL | Status: DC
  Administered 2022-01-16 – 2022-01-17 (×2): 40 mg via ORAL
  Filled 2022-01-15 (×3): qty 2

## 2022-01-15 NOTE — Group Note (Signed)
Recreation Therapy Group Note ? ? ?Group Topic:Team Building  ?Group Date: 01/15/2022 ?Start Time: 0930 ?End Time: 1000 ?Facilitators: Victorino Sparrow, LRT,CTRS ?Location: [place] ? ? ?Goal Area(s) Addresses:  ?Patient will effectively work with peer towards shared goal.  ?Patient will identify skills used to make activity successful.  ?Patient will identify how skills used during activity can be applied to reach post d/c goals.  ? ?Group Description: Tallest [employer]. In teams of 3-4, patients were given 25 small craft pipe cleaners. Using the materials provided, patients were instructed to compete again the opposing team(s) to build the tallest free-standing structure from floor level. The activity was timed; difficulty increased by Probation officer as Pharmacist, hospital continued.  Systematically resources were removed with additional directions for example, placing one arm behind their back, working in silence, and shape stipulations. LRT facilitated post-activity discussion reviewing team processes and necessary communication skills involved in completion. Patients were encouraged to reflect how the skills utilized, or not utilized, in this activity can be incorporated to positively impact support systems post discharge. ? ? ?Affect/Mood: Appropriate ?  ?Participation Level: Engaged ?  ?Participation Quality: Independent ?  ?Behavior: Appropriate ?  ?Speech/Thought Process: Focused ?  ?Insight: Good ?  ?Judgement: Good ?  ?Modes of Intervention: Team-building ?  ?Patient Response to Interventions:  Engaged ?  ?Education Outcome: ? Acknowledges education and In group clarification offered   ? ?Clinical Observations/Individualized Feedback: Pt was bright and completely focused on completing the activity.  Pt appeared to be the leader of her group.  Pt was also receptive to the ideas of peers.  Pt was appropriate during group session.  ? ? ?Plan: Continue to engage patient in RT group sessions  2-3x/week. ? ? ?Victorino Sparrow, LRT,CTRS  ?01/15/2022 1:05 PM ?

## 2022-01-15 NOTE — Group Note (Signed)
LCSW Group Therapy Note ? ? ?Group Date: 01/15/2022 ?Start Time: 1300 ?End Time: 1400 ? ?Type of Therapy and Topic:  Group Therapy:  Self-Care after Hospitalization ? ?Participation Level:  Active  ? ?Description of Group ?This process group involved patients discussing how they plan to take care of themselves in a better manner when they get home from the hospital.  The group started with patients listing one healthy and one unhealthy way they took care of themselves prior to hospitalization.  A discussion ensued about the differences in healthy and unhealthy coping skills.  Group members shared ideas about making changes when they return home so that they can stay well and in recovery.  The white board was used to list ideas so that patients can continue to see these ideas throughout the day. ? ?Therapeutic Goals ?Patient will identify and describe one healthy and one unhealthy coping technique used prior to hospitalization ?Patient will participate in generating ideas about healthy self-care options when they return to the community ?Patients will be supportive of one another and receive said support from others ?Patient will identify one healthy self-care activity to add to his/her post-hospitalization life that can help in recovery ? ?Summary of Patient Progress:  The patient expressed that prior to hospitalization some healthy self-care activity they engaged in was doing puzzles.  Patient's participated in group and was appropriate with their peers.   Patient accepted all worksheets and followed along with all group discussions.  ? ? ?Therapeutic Modalities ?Brief Solution-Focused Therapy ?Motivational Interviewing ?Psychoeducation ? ?Darleen Crocker, LCSWA ?01/15/2022  2:08 PM   ? ?

## 2022-01-15 NOTE — Progress Notes (Signed)
[facility] MD Progress Note ? ?01/15/2022 10:31 AM ?Cynthia Tapia  ?MRN:  [id number] ?Reason for Admission: "Cynthia" Tapia is a 56 year old female with PPHx PTSD and PMHx of atypical migraines, HTN, and HLD who presented to [facility] via EMS on 4/28 after a suicide attempt via ingestion of lonopin 42-60 mg, trazedone 1500 mg, 80 mg of Toradol, unknown quantity of zonegran and lamictal after a distressing therapy session. This is Cynthia's first inpatient psychiatric admission.  ?  ?While at [facility], patient was admitted to the ICU for bicarb gtt and found to have a GIB in which NG was placed for activated charcoal, at which time >1L of bloody gastric contents were removed. EGD was completed revealing the dx of esophagitis and non-bleeding gastric ulcers.  ? ?On assessment today:   ?Patient is seen in her room. She reports that last night, she had the best sleep she has had in a while, devoid of nightmares. She feels welcomed, safe, and not judged on the unit. We discuss at large the disease process of PTSD and how it can lead to actions otherwise unwanted. She is appreciative of the discussion. She denies somatic sx today (reporting resolution of her urinary tract sx) as well as SI/HI/AVH and paranoia of PTSD while on the unit. ? ?Principal Problem: Acute stress reaction causing mixed disturbance of emotion and conduct ?Diagnosis: Principal Problem: ?  Acute stress reaction causing mixed disturbance of emotion and conduct ?Active Problems: ?  Dissociative disorder ?  Anxiety ?  PTSD (post-traumatic stress disorder) ?  UTI ?  Intentional overdose of drug in tablet form ([place]) ? ? ?Total Time spent with patient: 20 minutes ? ?Past Psychiatric History: See H&P ? ?Past Medical History:  ?Past Medical History:  ?Diagnosis Date  ? Anxiety   ? GERD (gastroesophageal reflux disease)   ? History of hiatal hernia   ? Hyperlipidemia   ? Hypertension   ? Migraine   ?  ?Past Surgical History:  ?Procedure Laterality Date  ? BIOPSY  01/10/2022  ? Procedure:  BIOPSY;  Surgeon: Otis Brace, MD;  Location: [facility];  Service: Gastroenterology;;  ? CESAREAN SECTION    ? x 2  ? CHOLECYSTECTOMY N/A 06/23/2018  ? Procedure: LAPAROSCOPIC CHOLECYSTECTOMY;  Surgeon: Erroll Luna, MD;  Location: [place];  Service: General;  Laterality: N/A;  ? ESOPHAGOGASTRODUODENOSCOPY    ? ESOPHAGOGASTRODUODENOSCOPY N/A 01/10/2022  ? Procedure: ESOPHAGOGASTRODUODENOSCOPY (EGD);  Surgeon: Otis Brace, MD;  Location: [facility];  Service: Gastroenterology;  Laterality: N/A;  ? LAPAROSCOPIC HYSTERECTOMY    ? ?Family History:  ?Family History  ?Problem Relation Age of Onset  ? Migraines Mother   ? ?Family Psychiatric  History: see H&P ?Social History:  ?Social History  ? ?Substance and Sexual Activity  ?Alcohol Use Yes  ? Comment: occ  ?   ?Social History  ? ?Substance and Sexual Activity  ?Drug Use No  ?  ?Social History  ? ?Socioeconomic History  ? Marital status: Married  ?  Spouse name: Not on file  ? Number of children: Not on file  ? Years of education: Not on file  ? Highest education level: Not on file  ?Occupational History  ? Not on file  ?Tobacco Use  ? Smoking status: Never  ? Smokeless tobacco: Never  ?Vaping Use  ? Vaping Use: Never used  ?Substance and Sexual Activity  ? Alcohol use: Yes  ?  Comment: occ  ? Drug use: No  ? Sexual activity: Not on file  ?  Other Topics Concern  ? Not on file  ?Social History Narrative  ? Not on file  ? ?Social Determinants of Health  ? ?Financial Resource Strain: Not on file  ?Food Insecurity: Not on file  ?Transportation Needs: Not on file  ?Physical Activity: Not on file  ?Stress: Not on file  ?Social Connections: Not on file  ? ?Additional Social History:  ?  ?  ?  ? ?Sleep: Good ? ?Appetite:  Good ? ?Current Medications: ?Current Facility-Administered Medications  ?Medication Dose Route Frequency Provider Last Rate Last Admin  ? acetaminophen (TYLENOL) tablet 650 mg  650 mg Per Tube Q4H PRN Burt Ek, Gayland Curry, FNP      ? alum &  mag hydroxide-simeth (MAALOX/MYLANTA) 200-200-20 MG/5ML suspension 30 mL  30 mL Oral Q4H PRN Starkes-Perry, Gayland Curry, FNP      ? amLODipine (NORVASC) tablet 5 mg  5 mg Oral Daily Rosezetta Schlatter, MD   5 mg at 01/15/22 0818  ? cefadroxil (DURICEF) capsule 500 mg  500 mg Oral Q12H Suella Broad, FNP   500 mg at 01/15/22 6378  ? clonazePAM (KLONOPIN) tablet 2 mg  2 mg Oral Daily PRN Suella Broad, FNP      ? cloNIDine (CATAPRES) tablet 0.2 mg  0.2 mg Oral QHS Rosezetta Schlatter, MD   0.2 mg at 01/14/22 2048  ? docusate sodium (COLACE) capsule 100 mg  100 mg Oral BID PRN Massengill, Ovid Curd, MD      ? FLUoxetine (PROZAC) capsule 20 mg  20 mg Oral Daily Rosezetta Schlatter, MD   20 mg at 01/15/22 5885  ? hydrochlorothiazide (HYDRODIURIL) tablet 12.5 mg  12.5 mg Oral Daily Rosezetta Schlatter, MD   12.5 mg at 01/15/22 0818  ? irbesartan (AVAPRO) tablet 150 mg  150 mg Oral Daily Rosezetta Schlatter, MD   150 mg at 01/15/22 0818  ? lamoTRIgine (LAMICTAL) tablet 25 mg  25 mg Oral Daily Suella Broad, FNP   25 mg at 01/15/22 0277  ? magnesium hydroxide (MILK OF MAGNESIA) suspension 30 mL  30 mL Oral Daily PRN Suella Broad, FNP      ? pantoprazole (PROTONIX) EC tablet 40 mg  40 mg Oral BID Rosezetta Schlatter, MD   40 mg at 01/15/22 4128  ? pravastatin (PRAVACHOL) tablet 20 mg  20 mg Oral q morning Suella Broad, FNP   20 mg at 01/15/22 7867  ? zonisamide (ZONEGRAN) capsule 100 mg  100 mg Oral QHS Suella Broad, FNP   100 mg at 01/14/22 2048  ? ? ?Lab Results:  ?Results for orders placed or performed during the hospital encounter of 01/13/22 (from the past 48 hour(s))  ?Hemoglobin A1c     Status: None  ? Collection Time: 01/14/22  6:13 PM  ?Result Value Ref Range  ? Hgb A1c MFr Bld 5.5 4.8 - 5.6 %  ?  Comment: (NOTE) ?Pre diabetes:          5.7%-6.4% ? ?Diabetes:              >6.4% ? ?Glycemic control for   <7.0% ?adults with diabetes ?  ? Mean Plasma Glucose 111.15 mg/dL  ?  Comment:  Performed at [facility], [place] [address] [place], [place] [address]  ?Lipid panel     Status: Abnormal  ? Collection Time: 01/14/22  6:13 PM  ?Result Value Ref Range  ? Cholesterol 204 (H) 0 - 200 mg/dL  ? Triglycerides 221 (H) <150 mg/dL  ?  HDL 49 >40 mg/dL  ? Total CHOL/HDL Ratio 4.2 RATIO  ? VLDL 44 (H) 0 - 40 mg/dL  ? LDL Cholesterol 111 (H) 0 - 99 mg/dL  ?  Comment:        ?Total Cholesterol/HDL:CHD Risk ?Coronary Heart Disease Risk Table ?                    Men   Women ? 1/2 Average Risk   3.4   3.3 ? Average Risk       5.0   4.4 ? 2 X Average Risk   9.6   7.1 ? 3 X Average Risk  23.4   11.0 ?       ?Use the calculated Patient Ratio ?above and the CHD Risk Table ?to determine the patient's CHD Risk. ?       ?ATP III CLASSIFICATION (LDL): ? <100     mg/dL   Optimal ? 100-129  mg/dL   Near or Above ?                   Optimal ? 130-159  mg/dL   Borderline ? 160-189  mg/dL   High ? >190     mg/dL   Very High ?Performed at [facility], [place] [address], [place], [place] [address] ?  ?TSH     Status: None  ? Collection Time: 01/14/22  6:13 PM  ?Result Value Ref Range  ? TSH 0.670 0.350 - 4.500 uIU/mL  ?  Comment: Performed by a 3rd Generation assay with a functional sensitivity of <=0.01 uIU/mL. ?Performed at [facility], [place] [address], [place], [place] [address] ?  ? ? ?Blood Alcohol level:  ?Lab Results  ?Component Value Date  ? ETH <10 01/10/2022  ? ? ?Metabolic Disorder Labs: ?Lab Results  ?Component Value Date  ? HGBA1C 5.5 01/14/2022  ? MPG 111.15 01/14/2022  ? ?No results found for: PROLACTIN ?Lab Results  ?Component Value Date  ? CHOL 204 (H) 01/14/2022  ? TRIG 221 (H) 01/14/2022  ? HDL 49 01/14/2022  ? CHOLHDL 4.2 01/14/2022  ? VLDL 44 (H) 01/14/2022  ? LDLCALC 111 (H) 01/14/2022  ? ? ?Physical Findings: ? ?Musculoskeletal: ?Strength & Muscle Tone: within normal limits ?Gait & Station: normal ?Patient leans: N/A ? ?Psychiatric Specialty Exam: ? ?Presentation   ?General Appearance: Appropriate for Environment; Casual; Well Groomed ? ? ?Eye Contact:Good ? ? ?Speech:Clear and Coherent; Normal Rate ? ? ?Speech Volume:Normal ? ? ?Handedness:Right ? ? ? ?Mood and Affect  ?

## 2022-01-15 NOTE — Progress Notes (Signed)
D. Pt has been calm and cooperative on the unit- observed attending groups. Pt rated her depression,hopelessness and anxiety a 3/3/6, respectively. Pt reported that her goal was to work on "how to forgive myself or begin to."  Pt currently denies SI/HI and AVH   ?A. Labs and vitals monitored. Pt given and educated on medications. Pt supported emotionally and encouraged to express concerns and ask questions.   ?R. Pt remains safe with 15 minute checks. Will continue POC. ? ?  ?

## 2022-01-15 NOTE — BH IP Treatment Plan (Signed)
Interdisciplinary Treatment and Diagnostic Plan Update ? ?01/15/2022 ?Time of Session: 9:30am  ?SALOTE WEIDMANN ?MRN: [id number] ? ?Principal Diagnosis: Acute stress reaction causing mixed disturbance of emotion and conduct ? ?Secondary Diagnoses: Principal Problem: ?  Acute stress reaction causing mixed disturbance of emotion and conduct ?Active Problems: ?  Dissociative disorder ?  Anxiety ?  PTSD (post-traumatic stress disorder) ?  UTI ?  Intentional overdose of drug in tablet form ([place]) ? ? ?Current Medications:  ?Current Facility-Administered Medications  ?Medication Dose Route Frequency Provider Last Rate Last Admin  ? acetaminophen (TYLENOL) tablet 650 mg  650 mg Per Tube Q4H PRN Burt Ek, Gayland Curry, FNP      ? alum & mag hydroxide-simeth (MAALOX/MYLANTA) 200-200-20 MG/5ML suspension 30 mL  30 mL Oral Q4H PRN Starkes-Perry, Gayland Curry, FNP      ? amLODipine (NORVASC) tablet 5 mg  5 mg Oral Daily Rosezetta Schlatter, MD   5 mg at 01/15/22 0818  ? cefadroxil (DURICEF) capsule 500 mg  500 mg Oral Q12H Suella Broad, FNP   500 mg at 01/15/22 4562  ? clonazePAM (KLONOPIN) tablet 2 mg  2 mg Oral Daily PRN Suella Broad, FNP      ? cloNIDine (CATAPRES) tablet 0.2 mg  0.2 mg Oral QHS Rosezetta Schlatter, MD   0.2 mg at 01/14/22 2048  ? docusate sodium (COLACE) capsule 100 mg  100 mg Oral BID PRN Massengill, Ovid Curd, MD      ? Derrill Memo ON 01/16/2022] FLUoxetine (PROZAC) capsule 40 mg  40 mg Oral Daily Rosezetta Schlatter, MD      ? hydrochlorothiazide (HYDRODIURIL) tablet 12.5 mg  12.5 mg Oral Daily Rosezetta Schlatter, MD   12.5 mg at 01/15/22 0818  ? irbesartan (AVAPRO) tablet 150 mg  150 mg Oral Daily Rosezetta Schlatter, MD   150 mg at 01/15/22 0818  ? lamoTRIgine (LAMICTAL) tablet 25 mg  25 mg Oral Daily Suella Broad, FNP   25 mg at 01/15/22 5638  ? magnesium hydroxide (MILK OF MAGNESIA) suspension 30 mL  30 mL Oral Daily PRN Suella Broad, FNP      ? pantoprazole (PROTONIX) EC tablet 40 mg  40 mg Oral BID  Rosezetta Schlatter, MD   40 mg at 01/15/22 9373  ? pravastatin (PRAVACHOL) tablet 20 mg  20 mg Oral q morning Suella Broad, FNP   20 mg at 01/15/22 4287  ? zonisamide (ZONEGRAN) capsule 100 mg  100 mg Oral QHS Suella Broad, FNP   100 mg at 01/14/22 2048  ? ?PTA Medications: ?Medications Prior to Admission  ?Medication Sig Dispense Refill Last Dose  ? cefadroxil (DURICEF) 500 MG capsule Take 1 capsule (500 mg total) by mouth every 12 (twelve) hours for 5 doses. 5 capsule 0   ? clonazePAM (KLONOPIN) 2 MG tablet Take 2 mg by mouth daily as needed for anxiety.     ? FLUoxetine (PROZAC) 20 MG capsule Take 1 capsule (20 mg total) by mouth daily.  3   ? Fremanezumab-vfrm (AJOVY) 225 MG/1.5ML SOSY Inject 225 mg into the skin every 28 (twenty-eight) days.     ? iron polysaccharides (NIFEREX) 150 MG capsule Take 150 mg by mouth every morning.     ? lamoTRIgine (LAMICTAL) 25 MG tablet Take 1 tablet (25 mg total) by mouth daily.     ? pantoprazole (PROTONIX) 40 MG tablet Take 1 tablet (40 mg total) by mouth 2 (two) times daily for 56 days, THEN 1 tablet (40 mg total)  daily.  1   ? pravastatin (PRAVACHOL) 20 MG tablet Take 20 mg by mouth every morning.     ? rizatriptan (MAXALT) 10 MG tablet Take 10 mg by mouth See admin instructions. Take one tablet (10 mg) by mouth at onset of migraine headache, may repeat in 2 hours if needed     ? Vitamin D, Ergocalciferol, (DRISDOL) 1.25 MG (50000 UNIT) CAPS capsule Take 50,000 Units by mouth every 14 (fourteen) days.     ? zonisamide (ZONEGRAN) 100 MG capsule Take 100 mg by mouth at bedtime.     ? ? ?Patient Stressors: Health problems   ?Other: difficulty with past    ? ?Patient Strengths: Ability for insight  ?Average or above average intelligence  ?Capable of independent living  ?General fund of knowledge  ?Motivation for treatment/growth  ?Supportive family/friends  ? ?Treatment Modalities: Medication Management, Group therapy, Case management,  ?1 to 1 session with  clinician, Psychoeducation, Recreational therapy. ? ? ?Physician Treatment Plan for Primary Diagnosis: Acute stress reaction causing mixed disturbance of emotion and conduct ?Long Term Goal(s): Improvement in symptoms so as ready for discharge  ? ?Short Term Goals: Ability to identify changes in lifestyle to reduce recurrence of condition will improve ?Ability to verbalize feelings will improve ?Ability to disclose and discuss suicidal ideas ?Ability to demonstrate self-control will improve ?Ability to identify and develop effective coping behaviors will improve ?Ability to maintain clinical measurements within normal limits will improve ?Compliance with prescribed medications will improve ? ?Medication Management: Evaluate patient's response, side effects, and tolerance of medication regimen. ? ?Therapeutic Interventions: 1 to 1 sessions, Unit Group sessions and Medication administration. ? ?Evaluation of Outcomes: Not Met ? ?Physician Treatment Plan for Secondary Diagnosis: Principal Problem: ?  Acute stress reaction causing mixed disturbance of emotion and conduct ?Active Problems: ?  Dissociative disorder ?  Anxiety ?  PTSD (post-traumatic stress disorder) ?  UTI ?  Intentional overdose of drug in tablet form ([place]) ? ?Long Term Goal(s): Improvement in symptoms so as ready for discharge  ? ?Short Term Goals: Ability to identify changes in lifestyle to reduce recurrence of condition will improve ?Ability to verbalize feelings will improve ?Ability to disclose and discuss suicidal ideas ?Ability to demonstrate self-control will improve ?Ability to identify and develop effective coping behaviors will improve ?Ability to maintain clinical measurements within normal limits will improve ?Compliance with prescribed medications will improve    ? ?Medication Management: Evaluate patient's response, side effects, and tolerance of medication regimen. ? ?Therapeutic Interventions: 1 to 1 sessions, Unit Group sessions and  Medication administration. ? ?Evaluation of Outcomes: Not Met ? ? ?RN Treatment Plan for Primary Diagnosis: Acute stress reaction causing mixed disturbance of emotion and conduct ?Long Term Goal(s): Knowledge of disease and therapeutic regimen to maintain health will improve ? ?Short Term Goals: Ability to remain free from injury will improve, Ability to participate in decision making will improve, Ability to verbalize feelings will improve, Ability to disclose and discuss suicidal ideas, and Ability to identify and develop effective coping behaviors will improve ? ?Medication Management: RN will administer medications as ordered by provider, will assess and evaluate patient's response and provide education to patient for prescribed medication. RN will report any adverse and/or side effects to prescribing provider. ? ?Therapeutic Interventions: 1 on 1 counseling sessions, Psychoeducation, Medication administration, Evaluate responses to treatment, Monitor vital signs and CBGs as ordered, Perform/monitor CIWA, COWS, AIMS and Fall Risk screenings as ordered, Perform wound care treatments as ordered. ? ?  Evaluation of Outcomes: Not Met ? ? ?LCSW Treatment Plan for Primary Diagnosis: Acute stress reaction causing mixed disturbance of emotion and conduct ?Long Term Goal(s): Safe transition to appropriate next level of care at discharge, Engage patient in therapeutic group addressing interpersonal concerns. ? ?Short Term Goals: Engage patient in aftercare planning with referrals and resources, Increase social support, Increase emotional regulation, Facilitate acceptance of mental health diagnosis and concerns, Identify triggers associated with mental health/substance abuse issues, and Increase skills for wellness and recovery ? ?Therapeutic Interventions: Assess for all discharge needs, 1 to 1 time with Education officer, museum, Explore available resources and support systems, Assess for adequacy in community support network, Educate  family and significant other(s) on suicide prevention, Complete Psychosocial Assessment, Interpersonal group therapy. ? ?Evaluation of Outcomes: Not Met ? ? ?Progress in Treatment: ?Attending groups: Yes.

## 2022-01-15 NOTE — [facility] Group Notes (Signed)
PT attended NA and was attentive. ?

## 2022-01-15 NOTE — Progress Notes (Signed)
Patient did not attend morning orientation/goal group.  ?

## 2022-01-15 NOTE — Progress Notes (Signed)
Pt has been calm and cooperative. Pt had just gotten off the phone with her son when this Probation officer assessed pt initially. Pt shared that her family was able to postpone her grandsons Baptism, so she would be able to attend it. Pt was happy, but also shared that she wanted to cry. Pt reports finding groups helpful and has learned that it's okay "to relay on each other." Pt denies SI/HI and AVH. Active listening, reassurance, and support provided. Q 15 min safety checks continue. Pt's safety has been maintained. ? ? 01/14/22 2048  ?Psych Admission Type (Psych Patients Only)  ?Admission Status Voluntary  ?Psychosocial Assessment  ?Patient Complaints Anxiety;Depression;Sadness  ?Eye Contact Fair  ?Facial Expression Anxious  ?Affect Anxious;Appropriate to circumstance  ?Speech Logical/coherent  ?Interaction Assertive  ?Motor Activity Other (Comment) ?(steady)  ?Appearance/Hygiene Unremarkable  ?Behavior Characteristics Cooperative;Appropriate to situation;Anxious  ?Mood Anxious;Depressed;Pleasant  ?Thought Process  ?Coherency WDL  ?Content WDL  ?Delusions None reported or observed  ?Perception WDL  ?Hallucination None reported or observed  ?Judgment Limited  ?Confusion None  ?Danger to Self  ?Current suicidal ideation? Denies  ?Danger to Others  ?Danger to Others None reported or observed  ? ? ?

## 2022-01-16 DIAGNOSIS — T50902A Poisoning by unspecified drugs, medicaments and biological substances, intentional self-harm, initial encounter: Secondary | ICD-10-CM

## 2022-01-16 NOTE — Plan of Care (Signed)
?  Problem: Education: ?Goal: Knowledge of [facility] Education information/materials will improve ?Outcome: Progressing ?Goal: Emotional status will improve ?Outcome: Progressing ?Goal: Mental status will improve ?Outcome: Progressing ?Goal: Verbalization of understanding the information provided will improve ?Outcome: Progressing ?  ?Problem: Activity: ?Goal: Interest or engagement in activities will improve ?Outcome: Progressing ?Goal: Sleeping patterns will improve ?Outcome: Progressing ?  ?Problem: Coping: ?Goal: Coping ability will improve ?Outcome: Progressing ?  ?Problem: Medication: ?Goal: Compliance with prescribed medication regimen will improve ?Outcome: Progressing ?  ?Problem: Activity: ?Goal: Imbalance in normal sleep/wake cycle will improve ?Outcome: Progressing ?  ?Problem: Self-Concept: ?Goal: Level of anxiety will decrease ?Outcome: Not Progressing ?  ?Problem: Self-Concept: ?Goal: Level of anxiety will decrease ?Outcome: Not Progressing ?  ?

## 2022-01-16 NOTE — Progress Notes (Signed)
[facility] MD Progress Note ? ?01/16/2022 8:05 AM ?Cynthia Tapia  ?MRN:  [id number] ?Reason for Admission: "Cynthia Tapia" Prete is a 56 year old female with PPHx PTSD and PMHx of atypical migraines, HTN, and HLD who presented to [facility] via EMS on 4/28 after a suicide attempt via ingestion of lonopin 42-60 mg, trazedone 1500 mg, 80 mg of Toradol, unknown quantity of zonegran and lamictal after a distressing therapy session. This is Cynthia Tapia's first inpatient psychiatric admission.  ?  ?While at [facility], patient was admitted to the ICU for bicarb gtt and found to have a GIB in which NG was placed for activated charcoal, at which time >1L of bloody gastric contents were removed. EGD was completed revealing the dx of esophagitis and non-bleeding gastric ulcers.  ? ?On assessment today:   ?Patient is seen in the unit nook. She reports that last night, she again slept well without nightmares. She acknowledges the progress she has made and looks forward to planning for future therapy sessions as well as applying techniques learned on the unit when she returns home. She denies somatic sx today as well as SI/HI/AVH and paranoia of PTSD while on the unit. She feels ready to discharge tomorrow. ? ?Principal Problem: Acute stress reaction causing mixed disturbance of emotion and conduct ?Diagnosis: Principal Problem: ?  Acute stress reaction causing mixed disturbance of emotion and conduct ?Active Problems: ?  Dissociative disorder ?  Anxiety ?  PTSD (post-traumatic stress disorder) ?  UTI ?  Intentional overdose of drug in tablet form ([place]) ? ? ?Total Time spent with patient: 20 minutes ? ?Past Psychiatric History: See H&P ? ?Past Medical History:  ?Past Medical History:  ?Diagnosis Date  ? Anxiety   ? GERD (gastroesophageal reflux disease)   ? History of hiatal hernia   ? Hyperlipidemia   ? Hypertension   ? Migraine   ?  ?Past Surgical History:  ?Procedure Laterality Date  ? BIOPSY  01/10/2022  ? Procedure: BIOPSY;  Surgeon: Otis Brace, MD;  Location:  [facility]OS[facility];  Service: Gastroenterology;;  ? CESAREAN SECTION    ? x 2  ? CHOLECYSTECTOMY N/A 06/23/2018  ? Procedure: LAPAROSCOPIC CHOLECYSTECTOMY;  Surgeon: Erroll Luna, MD;  Location: [place];  Service: General;  Laterality: N/A;  ? ESOPHAGOGASTRODUODENOSCOPY    ? ESOPHAGOGASTRODUODENOSCOPY N/A 01/10/2022  ? Procedure: ESOPHAGOGASTRODUODENOSCOPY (EGD);  Surgeon: Otis Brace, MD;  Location: [facility];  Service: Gastroenterology;  Laterality: N/A;  ? LAPAROSCOPIC HYSTERECTOMY    ? ?Family History:  ?Family History  ?Problem Relation Age of Onset  ? Migraines Mother   ? ?Family Psychiatric  History: see H&P ?Social History:  ?Social History  ? ?Substance and Sexual Activity  ?Alcohol Use Yes  ? Comment: occ  ?   ?Social History  ? ?Substance and Sexual Activity  ?Drug Use No  ?  ?Social History  ? ?Socioeconomic History  ? Marital status: Married  ?  Spouse name: Not on file  ? Number of children: Not on file  ? Years of education: Not on file  ? Highest education level: Not on file  ?Occupational History  ? Not on file  ?Tobacco Use  ? Smoking status: Never  ? Smokeless tobacco: Never  ?Vaping Use  ? Vaping Use: Never used  ?Substance and Sexual Activity  ? Alcohol use: Yes  ?  Comment: occ  ? Drug use: No  ? Sexual activity: Not on file  ?Other Topics Concern  ? Not on file  ?Social History Narrative  ?  Not on file  ? ?Social Determinants of Health  ? ?Financial Resource Strain: Not on file  ?Food Insecurity: Not on file  ?Transportation Needs: Not on file  ?Physical Activity: Not on file  ?Stress: Not on file  ?Social Connections: Not on file  ? ?Additional Social History:  ?  ?  ?  ? ?Sleep: Good ? ?Appetite:  Good ? ?Current Medications: ?Current Facility-Administered Medications  ?Medication Dose Route Frequency Provider Last Rate Last Admin  ? acetaminophen (TYLENOL) tablet 650 mg  650 mg Per Tube Q4H PRN Burt Ek, Gayland Curry, FNP      ? alum & mag hydroxide-simeth (MAALOX/MYLANTA) 200-200-20  MG/5ML suspension 30 mL  30 mL Oral Q4H PRN Starkes-Perry, Gayland Curry, FNP      ? amLODipine (NORVASC) tablet 5 mg  5 mg Oral Daily Rosezetta Schlatter, MD   5 mg at 01/16/22 0802  ? cefadroxil (DURICEF) capsule 500 mg  500 mg Oral Q12H Suella Broad, FNP   500 mg at 01/16/22 0802  ? clonazePAM (KLONOPIN) tablet 2 mg  2 mg Oral Daily PRN Suella Broad, FNP      ? cloNIDine (CATAPRES) tablet 0.2 mg  0.2 mg Oral QHS Rosezetta Schlatter, MD   0.2 mg at 01/15/22 2136  ? docusate sodium (COLACE) capsule 100 mg  100 mg Oral BID PRN Massengill, Ovid Curd, MD      ? FLUoxetine (PROZAC) capsule 40 mg  40 mg Oral Daily Rosezetta Schlatter, MD   40 mg at 01/16/22 0802  ? hydrochlorothiazide (HYDRODIURIL) tablet 12.5 mg  12.5 mg Oral Daily Rosezetta Schlatter, MD   12.5 mg at 01/16/22 0802  ? irbesartan (AVAPRO) tablet 150 mg  150 mg Oral Daily Rosezetta Schlatter, MD   150 mg at 01/16/22 0802  ? lamoTRIgine (LAMICTAL) tablet 25 mg  25 mg Oral Daily Suella Broad, FNP   25 mg at 01/16/22 0802  ? magnesium hydroxide (MILK OF MAGNESIA) suspension 30 mL  30 mL Oral Daily PRN Suella Broad, FNP      ? pantoprazole (PROTONIX) EC tablet 40 mg  40 mg Oral BID Rosezetta Schlatter, MD   40 mg at 01/16/22 0802  ? pravastatin (PRAVACHOL) tablet 20 mg  20 mg Oral q morning Suella Broad, FNP   20 mg at 01/16/22 0801  ? zonisamide (ZONEGRAN) capsule 100 mg  100 mg Oral QHS Suella Broad, FNP   100 mg at 01/15/22 2119  ? ? ?Lab Results:  ?Results for orders placed or performed during the hospital encounter of 01/13/22 (from the past 48 hour(s))  ?Hemoglobin A1c     Status: None  ? Collection Time: 01/14/22  6:13 PM  ?Result Value Ref Range  ? Hgb A1c MFr Bld 5.5 4.8 - 5.6 %  ?  Comment: (NOTE) ?Pre diabetes:          5.7%-6.4% ? ?Diabetes:              >6.4% ? ?Glycemic control for   <7.0% ?adults with diabetes ?  ? Mean Plasma Glucose 111.15 mg/dL  ?  Comment: Performed at [facility], [place] [address]  [place], [place] [address]  ?Lipid panel     Status: Abnormal  ? Collection Time: 01/14/22  6:13 PM  ?Result Value Ref Range  ? Cholesterol 204 (H) 0 - 200 mg/dL  ? Triglycerides 221 (H) <150 mg/dL  ? HDL 49 >40 mg/dL  ? Total CHOL/HDL Ratio 4.2 RATIO  ?  VLDL 44 (H) 0 - 40 mg/dL  ? LDL Cholesterol 111 (H) 0 - 99 mg/dL  ?  Comment:        ?Total Cholesterol/HDL:CHD Risk ?Coronary Heart Disease Risk Table ?                    Men   Women ? 1/2 Average Risk   3.4   3.3 ? Average Risk       5.0   4.4 ? 2 X Average Risk   9.6   7.1 ? 3 X Average Risk  23.4   11.0 ?       ?Use the calculated Patient Ratio ?above and the CHD Risk Table ?to determine the patient's CHD Risk. ?       ?ATP III CLASSIFICATION (LDL): ? <100     mg/dL   Optimal ? 100-129  mg/dL   Near or Above ?                   Optimal ? 130-159  mg/dL   Borderline ? 160-189  mg/dL   High ? >190     mg/dL   Very High ?Performed at [facility], [place] [address], [place], [place] [address] ?  ?TSH     Status: None  ? Collection Time: 01/14/22  6:13 PM  ?Result Value Ref Range  ? TSH 0.670 0.350 - 4.500 uIU/mL  ?  Comment: Performed by a 3rd Generation assay with a functional sensitivity of <=0.01 uIU/mL. ?Performed at [facility], [place] [address], [place], [place] [address] ?  ? ? ?Blood Alcohol level:  ?Lab Results  ?Component Value Date  ? ETH <10 01/10/2022  ? ? ?Metabolic Disorder Labs: ?Lab Results  ?Component Value Date  ? HGBA1C 5.5 01/14/2022  ? MPG 111.15 01/14/2022  ? ?No results found for: PROLACTIN ?Lab Results  ?Component Value Date  ? CHOL 204 (H) 01/14/2022  ? TRIG 221 (H) 01/14/2022  ? HDL 49 01/14/2022  ? CHOLHDL 4.2 01/14/2022  ? VLDL 44 (H) 01/14/2022  ? LDLCALC 111 (H) 01/14/2022  ? ? ?Physical Findings: ? ?Musculoskeletal: ?Strength & Muscle Tone: within normal limits ?Gait & Station: normal ?Patient leans: N/A ? ?Psychiatric Specialty Exam: ? ?Presentation  ?General Appearance: Appropriate for Environment;  Casual; Well Groomed ? ? ?Eye Contact:Good ? ? ?Speech:Clear and Coherent; Normal Rate ? ? ?Speech Volume:Normal ? ? ?Handedness:Right ? ? ? ?Mood and Affect  ?Mood:Euthymic ? ? ?Affect:Congruent; Full Range ?

## 2022-01-16 NOTE — [facility] Group Notes (Signed)
Adult Psychoeducational Group Note ? ?Date:  01/16/2022 ?Time:  3:07 PM ? ?Group Topic/Focus:  ?Wellness Toolbox:   The focus of this group is to discuss various aspects of wellness, balancing those aspects and exploring ways to increase the ability to experience wellness.  Patients will create a wellness toolbox for use upon discharge. ? ?Participation Level:  Active ? ?Participation Quality:  Attentive ? ?Affect:  Appropriate ? ?Cognitive:  Alert ? ?Insight: Appropriate ? ?Engagement in Group:  Engaged ? ?Modes of Intervention:  Activity ? ?Additional Comments:  Patient attended and participated in the relaxation group activity. ? ?Annie Sable ?01/16/2022, 3:07 PM ?

## 2022-01-16 NOTE — Progress Notes (Signed)
Patient appears pleasant. Patient denies SI/HI/AVH. Patient complied with morning medication with no reported side effects. Pt reported anxiety as a 6/10 and depression a 1/10. Pt reports she slept "really well" and has a good appetite. Patient remains safe on Q87mn checks and contracts for safety.  ? ? ? ? 01/16/22 1000  ?Psych Admission Type (Psych Patients Only)  ?Admission Status Voluntary  ?Psychosocial Assessment  ?Patient Complaints Anxiety  ?Eye Contact Fair  ?Facial Expression Anxious  ?Affect Anxious;Appropriate to circumstance  ?Speech Logical/coherent  ?Interaction Assertive  ?Motor Activity Other (Comment) ?(WDL)  ?Appearance/Hygiene Unremarkable  ?Behavior Characteristics Cooperative;Appropriate to situation  ?Mood Anxious;Pleasant  ?Thought Process  ?Coherency WDL  ?Content WDL  ?Delusions None reported or observed  ?Perception WDL  ?Hallucination None reported or observed  ?Judgment Impaired  ?Confusion None  ?Danger to Self  ?Current suicidal ideation? Denies  ?Danger to Others  ?Danger to Others None reported or observed  ? ? ?

## 2022-01-16 NOTE — Progress Notes (Signed)
? ? ?   01/15/22 2136  ?Psych Admission Type (Psych Patients Only)  ?Admission Status Voluntary  ?Psychosocial Assessment  ?Patient Complaints Anxiety  ?Eye Contact Fair  ?Facial Expression Anxious  ?Affect Appropriate to circumstance  ?Speech Logical/coherent  ?Interaction Assertive  ?Motor Activity Other (Comment) ?(WNL)  ?Appearance/Hygiene Unremarkable  ?Behavior Characteristics Cooperative  ?Mood Pleasant;Anxious  ?Thought Process  ?Coherency WDL  ?Content WDL  ?Delusions None reported or observed  ?Perception WDL  ?Hallucination None reported or observed  ?Judgment Limited  ?Confusion None  ?Danger to Self  ?Current suicidal ideation? Denies  ?Danger to Others  ?Danger to Others None reported or observed  ? ? ?

## 2022-01-16 NOTE — [facility] Group Notes (Signed)
Goals Group  ?4-10 ?PT attended group and contributed to group ?

## 2022-01-16 NOTE — Progress Notes (Signed)
Pt states goal for the day is to `"identify self care strategies by attending group". Pt remains safe on Q 15 min checks. ?

## 2022-01-16 NOTE — Progress Notes (Signed)
[facility] Group Notes:  (Nursing/MHT/Case Management/Adjunct) ? ?Date:  01/16/2022  ?Time:  2015 ?Type of Therapy:   wrap  up group ? ?Participation Level:  Active ? ?Participation Quality:  Appropriate, Attentive, Sharing, and Supportive ? ?Affect:  Appropriate ? ?Cognitive:  Alert ? ?Insight:  Improving ? ?Engagement in Group:  Engaged ? ?Modes of Intervention:  Clarification, Education, and Support ? ?Summary of Progress/Problems: Positive thinking and positive change were discussed.  ? ?Winfield Rast S ?01/16/2022, 9:42 PM ?

## 2022-01-16 NOTE — [facility] Suicide Risk Assessment (Signed)
[facility] INPATIENT:  Family/Significant Other Suicide Prevention Education ? ?Suicide Prevention Education:  ?Education Completed; Jaleyah Longhi [phone] (Husband) has been identified by the patient as the family member/significant other with whom the patient will be residing, and identified as the person(s) who will aid the patient in the event of a mental health crisis (suicidal ideations/suicide attempt).  With written consent from the patient, the family member/significant other has been provided the following suicide prevention education, prior to the and/or following the discharge of the patient. ? ?The suicide prevention education provided includes the following: ?Suicide risk factors ?Suicide prevention and interventions ?National Suicide Hotline telephone number ?[facility] assessment telephone number ?[facility] Emergency Assistance 911 ?[place] and/or Residential Mobile Crisis Unit telephone number ? ?Request made of family/significant other to: ?Remove weapons (e.g., guns, rifles, knives), all items previously/currently identified as safety concern.   ?Remove drugs/medications (over-the-counter, prescriptions, illicit drugs), all items previously/currently identified as a safety concern. ? ?The family member/significant other verbalizes understanding of the suicide prevention education information provided.  The family member/significant other agrees to remove the items of safety concern listed above. ? ?CSW spoke with Mr. Chipley who states that he will be keeping his wife's medications at this office and only brining home what she needs each day.  He states that he has relocated the firearm in the home and that she does not know where it is located.  He states that he will continue to be a support for his wife after discharge.  He confirms that his wife can return to the home after discharge.  He also states that he has spoken with his wife's therapist and they will be seeing her 1  additional time before her appointment on 01/23/2022.  CSW completed SPE with Mr. Marlatt.  ? ?Darleen Crocker ?01/16/2022, 12:04 PM ?

## 2022-01-16 NOTE — Plan of Care (Signed)
°  Problem: Education: °Goal: Emotional status will improve °Outcome: Progressing °Goal: Mental status will improve °Outcome: Progressing °Goal: Verbalization of understanding the information provided will improve °Outcome: Progressing °  °

## 2022-01-17 MED ORDER — FLUOXETINE HCL 20 MG PO CAPS: 40.0000 mg | ORAL_CAPSULE | Freq: Every day | ORAL | 0 refills | Status: DC

## 2022-01-17 MED ORDER — CLONIDINE HCL 0.2 MG PO TABS: 0.2000 mg | ORAL_TABLET | Freq: Every day | ORAL | 0 refills | Status: DC

## 2022-01-17 MED ORDER — AMLODIPINE BESYLATE 5 MG PO TABS: 5.0000 mg | ORAL_TABLET | Freq: Every day | ORAL | 0 refills | Status: DC
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
## 2022-01-17 NOTE — Discharge Instructions (Signed)
Activity: as tolerated ? ?Diet: heart healthy ? ?Other: ?-Follow-up with your outpatient psychiatric provider -instructions on appointment date, time, and address (location) are provided to you in discharge paperwork. ? ?-Take your psychiatric medications as prescribed at discharge - instructions are provided to you in the discharge paperwork ? ?-Follow-up with outpatient primary care doctor and other specialists -for management of chronic medical disease, including: elevated cholesterol, non-bleeding GI ulcer, and LA Grade B Esophagitis. ? ?-Testing: Follow-up with outpatient provider for abnormal lab results: Repeat EGD 2 months after discharge, LDL 111. ? ?-Recommend abstinence from alcohol, tobacco, and other illicit drug use at discharge.  ? ?-If your psychiatric symptoms recur, worsen, or if you have side effects to your psychiatric medications, call your outpatient psychiatric provider, 911, [phone] or go to the nearest emergency department. ? ?-If suicidal thoughts recur, call your outpatient psychiatric provider, 911, [phone] or go to the nearest emergency department. ?

## 2022-01-17 NOTE — Plan of Care (Addendum)
Discharge note: Patient discharged home per MD order. Patient received all personal items from locker and unit. ?Reviewed AVS/transition record with patient and she indicates understanding. Patient denies any thoughts of self harm. Patient left ambulatory with her husband. ?

## 2022-01-17 NOTE — [facility] Group Notes (Signed)
Adult Orientation Group Note ? ?Date:  01/17/2022 ?Time:  10:40 AM ? ?Group Topic/Focus:  ?Orientation:   The focus of this group is to educate the patient on the purpose and policies of crisis stabilization and provide a format to answer questions about their admission.  The group details unit policies and expectations of patients while admitted. ? ?Participation Level:  Active ? ?Participation Quality:  Appropriate ? ?Affect:  Appropriate ? ?Cognitive:  Appropriate ? ?Insight: Appropriate ? ?Engagement in Group:  Engaged ? ?Modes of Intervention:  Education ? ? ? ?Frutoso Chase ?01/17/2022, 10:40 AM ?

## 2022-01-17 NOTE — Discharge Summary (Addendum)
Physician Discharge Summary Note ? ?Patient:  Cynthia Tapia is an 56 y.o., female ?MRN:  [id number] ?DOB:  1965-09-17 ?Patient phone:  [phone] (home)  ?Patient address:   ?[address] ?[place] [address],  ?Total Time spent with patient: 30 minutes ? ?Date of Admission:  01/13/2022 ?Date of Discharge: 01/17/2022 ? ?Reason for Admission:  "Cynthia" Tapia is a 56 year old female with PPHx PTSD and PMHx of atypical migraines, HTN, and HLD who presented to [facility] via EMS on 4/28 after a suicide attempt via ingestion of lonopin 42-60 mg, trazedone 1500 mg, 80 mg of Toradol, unknown quantity of zonegran and lamictal after a distressing therapy session. This is Cynthia's first inpatient psychiatric admission.  ?  ?While at [facility], patient was admitted to the ICU for bicarb gtt and found to have a GIB in which NG was placed for activated charcoal, at which time >1L of bloody gastric contents were removed. EGD was completed revealing the dx of esophagitis and non-bleeding gastric ulcers.  ? ?Principal Problem: Acute stress reaction causing mixed disturbance of emotion and conduct ?Discharge Diagnoses: Principal Problem: ?  Acute stress reaction causing mixed disturbance of emotion and conduct ?Active Problems: ?  Dissociative disorder ?  Anxiety ?  PTSD (post-traumatic stress disorder) ?  UTI ?  Intentional overdose of drug in tablet form ([facility]) ? ? ? ?Past Psychiatric History:  ?Previous Psych Diagnoses: PTSD ?Prior inpatient treatment: None ?Current/prior outpatient treatment:  ?Prior rehab hx: ?Psychotherapy hx: Wende Bushy at [facility] ?History of suicide: Denies attempts; chronic SI ?History of homicide: Denies ?Psychiatric medication history: Lithium (d/c d/t 20 lb weight gain), Prazosin and Trazodone (ineffective); current regimen: Clonidine 0.2 mg, Prozac 60 mg, Lamictal 100 mg, and Clonazepam 2 mg PRN (reports taking 1 q 3-4 days) ?Psychiatric medication compliance history: Compliant ?Neuromodulation  history: Denies ?Current Psychiatrist: Freda Jackson, MD ?Current therapist: Wende Bushy ? ?Past Medical History:  ?Past Medical History:  ?Diagnosis Date  ? Anxiety   ? GERD (gastroesophageal reflux disease)   ? History of hiatal hernia   ? Hyperlipidemia   ? Hypertension   ? Migraine   ?  ?Past Surgical History:  ?Procedure Laterality Date  ? BIOPSY  01/10/2022  ? Procedure: BIOPSY;  Surgeon: Otis Brace, MD;  Location: [facility];  Service: Gastroenterology;;  ? CESAREAN SECTION    ? x 2  ? CHOLECYSTECTOMY N/A 06/23/2018  ? Procedure: LAPAROSCOPIC CHOLECYSTECTOMY;  Surgeon: Erroll Luna, MD;  Location: [facility];  Service: General;  Laterality: N/A;  ? ESOPHAGOGASTRODUODENOSCOPY    ? ESOPHAGOGASTRODUODENOSCOPY N/A 01/10/2022  ? Procedure: ESOPHAGOGASTRODUODENOSCOPY (EGD);  Surgeon: Otis Brace, MD;  Location: [facility];  Service: Gastroenterology;  Laterality: N/A;  ? LAPAROSCOPIC HYSTERECTOMY    ? ?Family History:  ?Family History  ?Problem Relation Age of Onset  ? Migraines Mother   ?Medical: HTN, T2DM, kidney dz ?Psych: Mom- BPD, and bipolar disorder ?Psych Rx: Unknown ?SA/HA: Paternal grandfather completed suicide ?Substance use family hx: Mom- unspecified addiction ? ?Social History:  ?Social History  ? ?Substance and Sexual Activity  ?Alcohol Use Yes  ? Comment: occ  ?   ?Social History  ? ?Substance and Sexual Activity  ?Drug Use No  ?  ?Social History  ? ?Socioeconomic History  ? Marital status: Married  ?  Spouse name: Not on file  ? Number of children: Not on file  ? Years of education: Not on file  ? Highest education level: Not on file  ?Occupational History  ?  Not on file  ?Tobacco Use  ? Smoking status: Never  ? Smokeless tobacco: Never  ?Vaping Use  ? Vaping Use: Never used  ?Substance and Sexual Activity  ? Alcohol use: Yes  ?  Comment: occ  ? Drug use: No  ? Sexual activity: Not on file  ?Other Topics Concern  ? Not on file  ?Social History Narrative  ? Not on file  ? ?Social  Determinants of Health  ? ?Financial Resource Strain: Not on file  ?Food Insecurity: Not on file  ?Transportation Needs: Not on file  ?Physical Activity: Not on file  ?Stress: Not on file  ?Social Connections: Not on file  ? ? ?Hospital Course:   ? ?During the patient's hospitalization, patient had extensive initial psychiatric evaluation, and follow-up psychiatric evaluations every day. ? ?Psychiatric diagnoses provided upon initial assessment:  ?#Intentional Overdose of Drug 2/2 Acute Stress Reaction ?#PTSD ? ?Patient's psychiatric medications were adjusted on admission:  ?-Continue home medications at decreased dosages: Lamictal 25 mg and Prozac 20 mg daily ?-Restart home Clonidine 0.2 mg qHS for nightmares ?-Resume home Clonazepam 2 mg daily PRN anxiety ? ?During the hospitalization, other adjustments were made to the patient's psychiatric medication regimen:  ?-Prozac increased to 40 mg ? ?Patient's care was discussed during the interdisciplinary team meeting every day during the hospitalization. ? ?The patient denies having side effects to prescribed psychiatric medication. ? ?Gradually, patient started adjusting to milieu. The patient was evaluated each day by a clinical provider to ascertain response to treatment. Improvement was noted by the patient's report of decreasing symptoms, improved sleep and appetite, affect, medication tolerance, behavior, and participation in unit programming.  Patient was asked each day to complete a self inventory noting mood, mental status, pain, new symptoms, anxiety and concerns.   ?Symptoms were reported as significantly decreased or resolved completely by discharge.  ?The patient reports that their mood is stable.  ?The patient denied having suicidal thoughts for more than 48 hours prior to discharge.  Patient denies having homicidal thoughts.  Patient denies having auditory hallucinations.  Patient denies any visual hallucinations or other symptoms of psychosis.  ?The  patient was motivated to continue taking medication with a goal of continued improvement in mental health.  ? ?The patient reports their target psychiatric symptoms of SI and severe anxiety and paranoia 2/2 PTSD responded well to the psychiatric medications, and the patient reports overall benefit other psychiatric hospitalization. Supportive psychotherapy was provided to the patient. The patient also participated in regular group therapy while hospitalized. Coping skills, problem solving as well as relaxation therapies were also part of the unit programming. ? ?Labs were reviewed with the patient, and abnormal results were discussed with the patient. ? ?The patient is able to verbalize their individual safety plan to this provider. ? ?# It is recommended to the patient to continue psychiatric medications as prescribed, after discharge from the hospital.   ? ?# It is recommended to the patient to follow up with your outpatient psychiatric provider and PCP. ? ?# It was discussed with the patient, the impact of alcohol, drugs, tobacco have been there overall psychiatric and medical wellbeing, and total abstinence from substance use was recommended the patient.ed. ? ?# Prescriptions provided or sent directly to preferred pharmacy at discharge. Patient agreeable to plan. Given opportunity to ask questions. Appears to feel comfortable with discharge.  ?  ?# In the event of worsening symptoms, the patient is instructed to call the crisis hotline, 911 and or  go to the nearest ED for appropriate evaluation and treatment of symptoms. To follow-up with primary care provider for other medical issues, concerns and or health care needs ? ?# Patient was discharged home with a plan to follow up as noted below. ? ?Physical Findings: ? ?Musculoskeletal: ?Strength & Muscle Tone: within normal limits ?Gait & Station: normal ?Patient leans: N/A ? ? ?Psychiatric Specialty Exam: ? ?Presentation  ?General Appearance: Appropriate for  Environment; Casual; Well Groomed ? ? ?Eye Contact:Good ? ? ?Speech:Clear and Coherent; Normal Rate ? ? ?Speech Volume:Normal ? ? ?Handedness:Right ? ? ? ?Mood and Affect  ?Mood:Euthymic ? ? ?Affect:Congruent; Appropriat

## 2022-01-17 NOTE — Progress Notes (Signed)
?   01/17/22 0900  ?Psych Admission Type (Psych Patients Only)  ?Admission Status Voluntary  ?Psychosocial Assessment  ?Patient Complaints Anxiety  ?Eye Contact Fair  ?Facial Expression Animated  ?Affect Appropriate to circumstance  ?Speech Logical/coherent  ?Interaction Assertive  ?Motor Activity Other (Comment) ?(wnl)  ?Appearance/Hygiene Unremarkable  ?Behavior Characteristics Cooperative  ?Mood Pleasant  ?Thought Process  ?Coherency WDL  ?Content WDL  ?Delusions None reported or observed  ?Perception WDL  ?Hallucination None reported or observed  ?Judgment Impaired  ?Confusion None  ?Danger to Self  ?Current suicidal ideation? Denies  ?Danger to Others  ?Danger to Others None reported or observed  ? ? ?

## 2022-01-17 NOTE — Progress Notes (Signed)
?   01/17/22 0400  ?Psych Admission Type (Psych Patients Only)  ?Admission Status Voluntary  ?Psychosocial Assessment  ?Patient Complaints Anxiety  ?Eye Contact Fair  ?Facial Expression Anxious  ?Affect Anxious;Appropriate to circumstance  ?Speech Logical/coherent  ?Interaction Assertive  ?Motor Activity Other (Comment) ?(WDL)  ?Appearance/Hygiene Unremarkable  ?Behavior Characteristics Cooperative;Appropriate to situation  ?Mood Anxious;Pleasant  ?Thought Process  ?Coherency WDL  ?Content WDL  ?Delusions None reported or observed  ?Perception WDL  ?Hallucination None reported or observed  ?Judgment Impaired  ?Confusion None  ?Danger to Self  ?Current suicidal ideation? Denies  ?Danger to Others  ?Danger to Others None reported or observed  ? ? ?

## 2022-01-17 NOTE — Progress Notes (Signed)
Patient ID: Cynthia Tapia, female   DOB: 1966-04-03, 56 y.o.   MRN: [id number] ?Discharge note: Patient discharged home per MD order. Patient received all personal belongings from unit and locker. Patient denies any thoughts of self harm. Reviewed AVS/transition/SRA with patient and she indicates understanding. Patient left ambulatory with her husband. ?

## 2022-01-17 NOTE — Progress Notes (Signed)
?  [facility] Adult Case Management Discharge Plan : ? ?Will you be returning to the same living situation after discharge:  Yes,  to home ?At discharge, do you have transportation home?: Yes,  husband to pick this patient up ?Do you have the ability to pay for your medications: Yes,  has insurance ? ?Release of information consent forms completed and in the chart;  Patient's signature needed at discharge. ? ?Patient to Follow up at: ? Follow-up Information   ? ? [facility] on 01/23/2022.   ?Why: You have an appointment for medication management services on 01/23/22 at 2:00 pm. (Virtual).  You also have an appointment for therapy services on 01/23/22 at 4:00 pm.  (in person). ?Contact information: ?[address] ?[place] [address] ?[phone] ? ? ?  ?  ? ?  ?  ? ?  ? ? ?Next level of care provider has access to [facility] ? ?Safety Planning and Suicide Prevention discussed: Yes,  with husband ? ?  ? ?Has patient been referred to the Quitline?: N/A patient is not a smoker ? ?Patient has been referred for addiction treatment: N/A ? ?Vassie Moselle, LCSW ?01/17/2022, 10:07 AM ?

## 2022-01-26 ENCOUNTER — Encounter (HOSPITAL_COMMUNITY): Payer: Self-pay | Admitting: Gastroenterology

## 2022-01-26 NOTE — Anesthesia Postprocedure Evaluation (Signed)
Anesthesia Post Note ? ?Patient: Cynthia Tapia ? ?Procedure(s) Performed: ESOPHAGOGASTRODUODENOSCOPY (EGD) ?BIOPSY ? ?  ? ?Patient location during evaluation: Endoscopy ?Anesthesia Type: General ?Level of consciousness: awake and alert ?Pain management: pain level controlled ?Vital Signs Assessment: post-procedure vital signs reviewed and stable ?Respiratory status: spontaneous breathing ?Cardiovascular status: stable ?Anesthetic complications: no ? ? ?No notable events documented. ? ?Last Vitals:  ?Vitals:  ? 01/13/22 0601 01/13/22 2300  ?BP: 119/63 (!) 147/89  ?Pulse: (!) 46 (!) 51  ?Resp: 18 18  ?Temp: 36.6 ?C 36.9 ?C  ?SpO2: 95% 95%  ?  ?Last Pain:  ?Vitals:  ? 01/13/22 2300  ?TempSrc: Oral  ?PainSc:   ? ? ?  ?  ?  ?  ?  ?  ? ?Nolon Nations ? ? ? ? ?

## 2022-12-30 IMAGING — DX DG ABDOMEN 1V
1 series · 1 of 1 positions shown · non-contrast
Comparison: 07/30/2006.

CLINICAL DATA: Overdose.

EXAM:
ABDOMEN - 1 VIEW; PORTABLE CHEST - 1 VIEW

[abdomen kub]
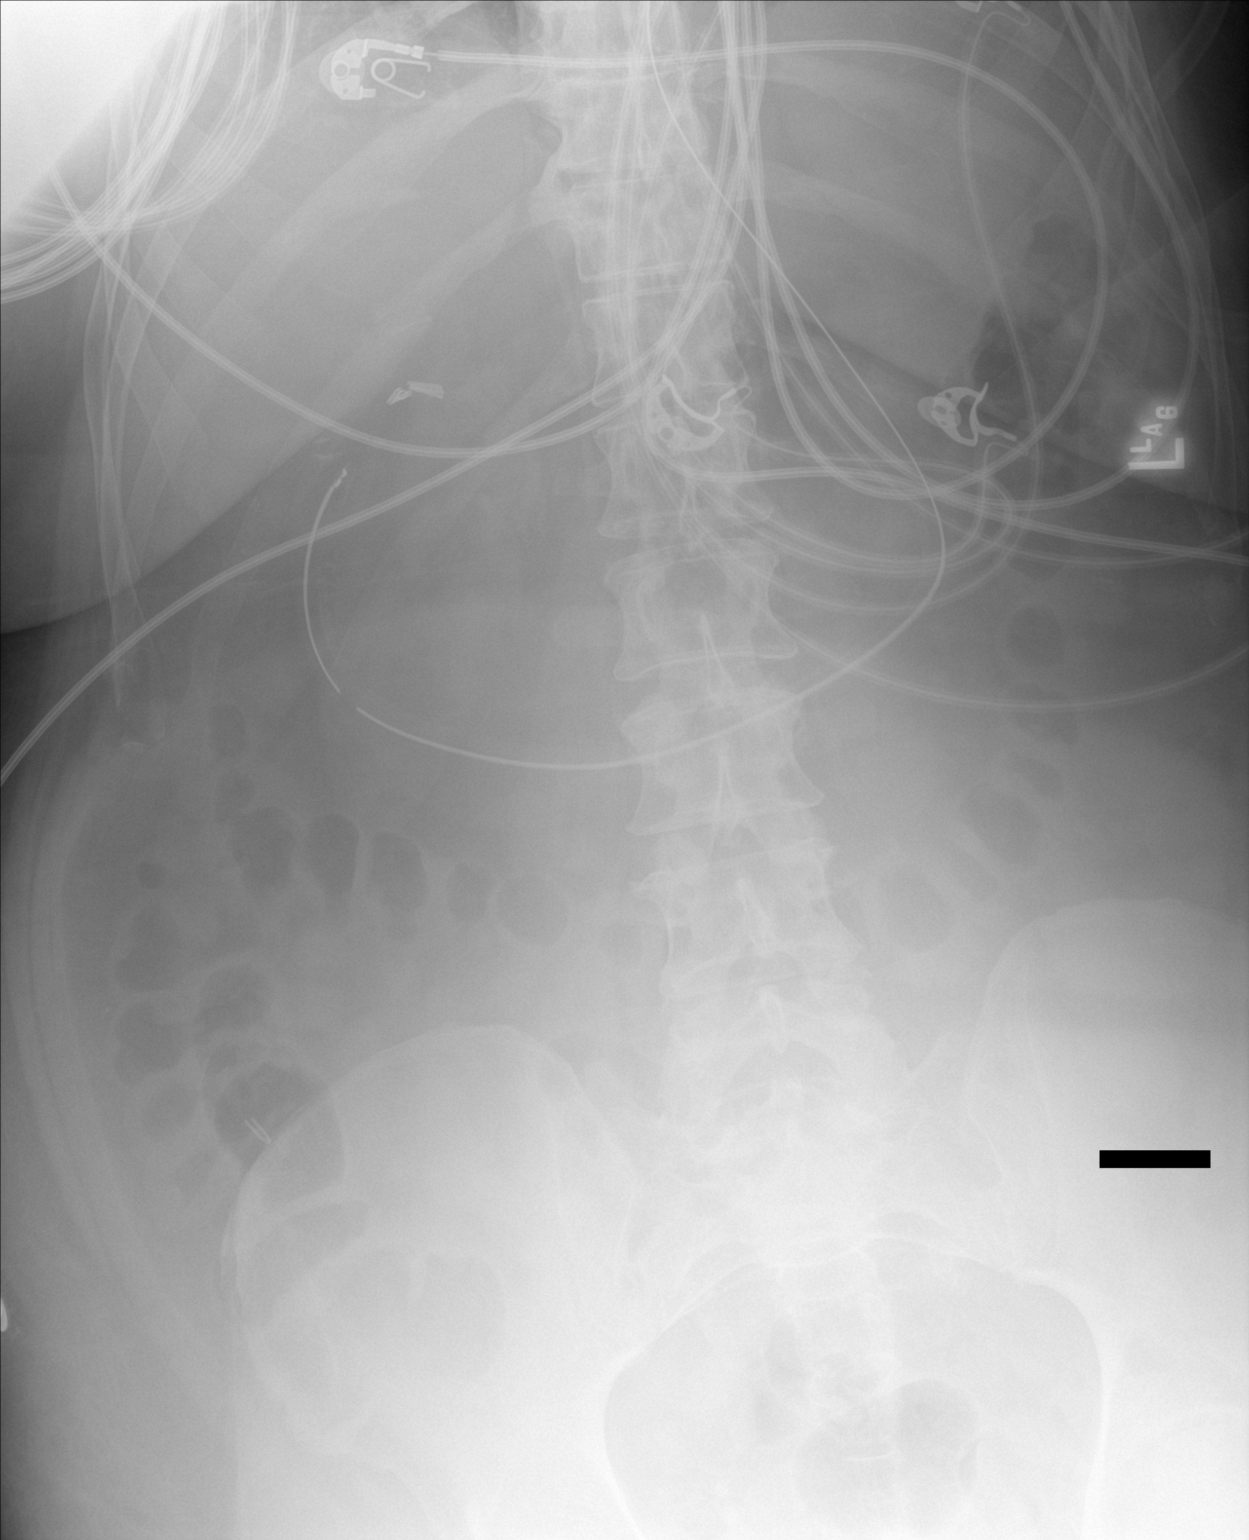

[1 of 1 positions shown; findings below may reference images not displayed]

FINDINGS: The heart is normal in size and the mediastinal contour are within
normal limits. Lung volumes are low. No consolidation, effusion, or
pneumothorax. No acute osseous abnormality.

The bowel gas pattern is normal. An enteric tube terminates in the
stomach. Surgical clips are present in the right upper quadrant and
right lower quadrant. No radio-opaque calculi. Mild degenerative
changes are noted in the lumbar spine.
IMPRESSION: 1. Low lung volumes with no acute cardiopulmonary process.
2. No evidence of bowel obstruction.
3. An enteric tube terminates in the stomach.

## 2022-12-30 IMAGING — DX DG CHEST 1V PORT
1 series · 1 of 1 positions shown · non-contrast
Comparison: 07/30/2006.

CLINICAL DATA: Overdose.

EXAM:
ABDOMEN - 1 VIEW; PORTABLE CHEST - 1 VIEW

[chest ap]
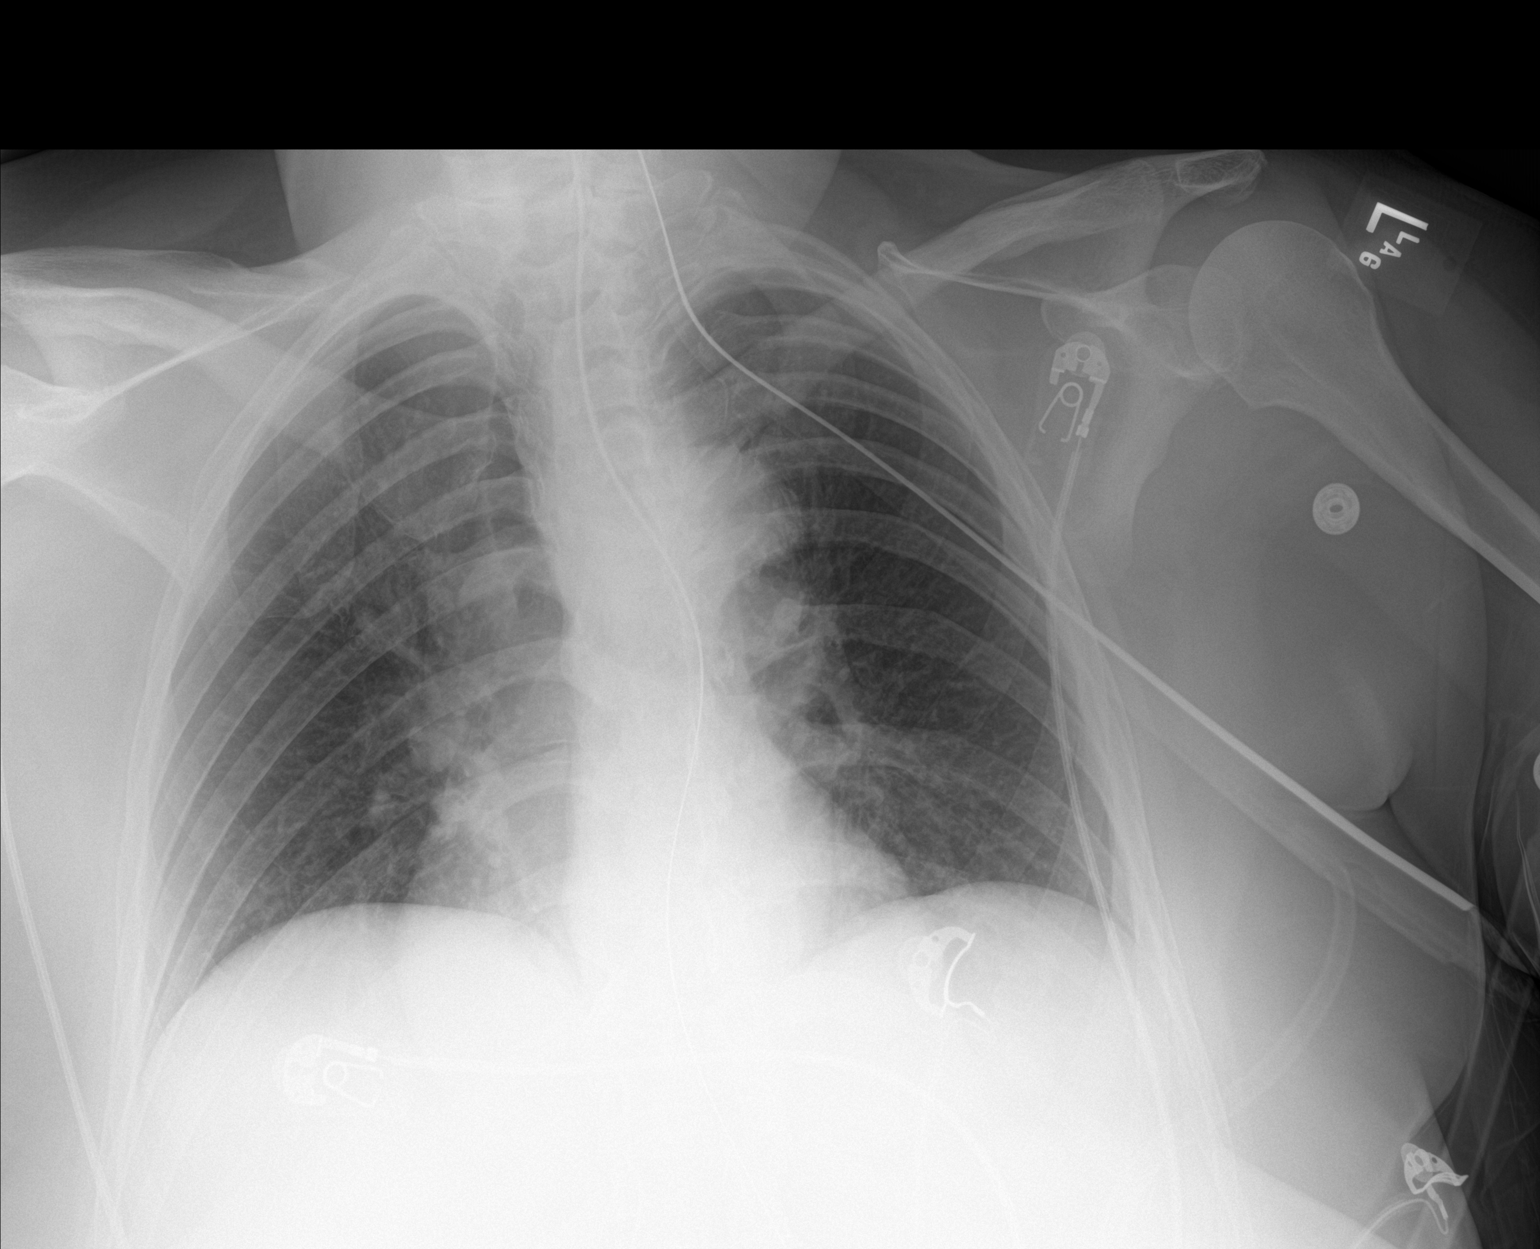

[1 of 1 positions shown; findings below may reference images not displayed]

FINDINGS: The heart is normal in size and the mediastinal contour are within
normal limits. Lung volumes are low. No consolidation, effusion, or
pneumothorax. No acute osseous abnormality.

The bowel gas pattern is normal. An enteric tube terminates in the
stomach. Surgical clips are present in the right upper quadrant and
right lower quadrant. No radio-opaque calculi. Mild degenerative
changes are noted in the lumbar spine.
IMPRESSION: 1. Low lung volumes with no acute cardiopulmonary process.
2. No evidence of bowel obstruction.
3. An enteric tube terminates in the stomach.

## 2023-04-13 NOTE — Progress Notes (Unsigned)
Referring:  Everlena Cooper, MD [address] [place],  [place] [address]  PCP: Tally Joe, MD  Neurology was asked to evaluate Cynthia Tapia, a 57 year old female for a chief complaint of headaches.  Our recommendations of care will be communicated by shared medical record.    CC:  headaches  History provided from self, husband  HPI:  Medical co-morbidities: PTSD  The patient presents for evaluation of headaches. States she has had migraines "all her life". Migraines are associated with visual aura, numbness in her face, aphasia, and confusion. She also has associated photophobia, phonophobia, and nausea. She has a constant lower level headache which progresses to a more severe migraine nearly every day. Has an aura once per week. Currently takes Qulipta for prevention which takes the edge off of her headache but has not reduced the frequency. She takes frovatriptan for rescue which she has not found helpful. States Ajovy was helpful in the past but wore off 1 week before her next shot. Imitrex helped for rescue but caused side effects.  MRI brain 12/18/14 showed a partially empty sella. She previously underwent LP which showed borderline elevated opening pressure of 21. She did not have papilledema at her last ophthalmology exam 6 months ago.  She is in treatment for PTSD which has helped with her migraines. She is very tearful during this visit.  Headache History: Onset: several years ago Aura: visual aura, numbness, aphasia, confusion Associated Symptoms:  Photophobia: yes  Phonophobia: yes  Nausea: yes Worse with activity?: yes Duration of headaches: constant  Migraine days per month: 30 Headache free days per month: 0  Current Treatment: Abortive Frovatriptan 2.5 mg PRN  Preventative Qulipta 60 mg daily Lamictal 100 mg daily Zonisamide 200 mg QHS Gabapentin 1000 mg daily  Prior Therapies                                 Preventive: Losartan   Amlodipine metoprolol Hydroxyzine Lamictal Topamax Zonisamide Diamox Fluoxetine Duloxetine Gabapentin Aimovig Ajovy  Qulipta Occipital nerve block  Rescue: Frovatriptan Rizatriptan - lack of efficacy Sumatriptan - helped but caused side effects Ubrelvy - lack of efficacy tizanidine  LABS: CBC    Component Value Date/Time   WBC 7.8 01/11/2022 0222   RBC 4.22 01/11/2022 0222   HGB 11.8 (L) 01/11/2022 0222   HCT 37.0 01/11/2022 0222   PLT 278 01/11/2022 0222   MCV 87.7 01/11/2022 0222   MCH 28.0 01/11/2022 0222   MCHC 31.9 01/11/2022 0222   RDW 13.5 01/11/2022 0222   LYMPHSABS 2.2 01/10/2022 0142   MONOABS 0.6 01/10/2022 0142   EOSABS 0.2 01/10/2022 0142   BASOSABS 0.0 01/10/2022 0142      Latest Ref Rng & Units 01/11/2022    2:22 AM 01/10/2022   10:11 AM 01/10/2022    1:42 AM  CMP  Glucose 70 - 99 mg/dL 308  98  657   BUN 6 - 20 mg/dL 11  14  20    Creatinine 0.44 - 1.00 mg/dL 8.46  9.62  9.52   Sodium 135 - 145 mmol/L 141  142  139   Potassium 3.5 - 5.1 mmol/L 3.9  3.4  3.6   Chloride 98 - 111 mmol/L 109  111  107   CO2 22 - 32 mmol/L 24  25  25    Calcium 8.9 - 10.3 mg/dL 8.7  8.2  9.2  Total Protein 6.5 - 8.1 g/dL  6.2  6.9   Total Bilirubin 0.3 - 1.2 mg/dL  0.4  0.6   Alkaline Phos 38 - 126 U/L  70  81   AST 15 - 41 U/L  14  19   ALT 0 - 44 U/L  14  16      IMAGING:  MRI brain 2016:   Sella is partially empty and slightly expanded. There is also minimal flattening of the posterior aspect the left globe and slightly prominent peri optic spaces. These findings have been described in patients with pseudotumor cerebri.  Imaging independently reviewed on April 14, 2023   Current Outpatient Medications on File Prior to Visit  Medication Sig Dispense Refill   gabapentin (NEURONTIN) 300 MG capsule Take by mouth. 1,000 mg daily and 200 mg prn per day     iron polysaccharides (NIFEREX) 150 MG capsule Take 150 mg by mouth every morning.     lamoTRIgine  (LAMICTAL) 25 MG tablet Take 1 tablet (25 mg total) by mouth daily.     pravastatin (PRAVACHOL) 20 MG tablet Take 20 mg by mouth every morning.     prazosin (MINIPRESS) 2 MG capsule Take 2 mg by mouth. 2 mg night and 2 mg prn as needed     QULIPTA 60 MG TABS Take 60 mg by mouth daily.     amLODipine (NORVASC) 5 MG tablet Take 1 tablet (5 mg total) by mouth daily. 30 tablet 0   clonazePAM (KLONOPIN) 2 MG tablet Take 2 mg by mouth daily as needed for anxiety.     cloNIDine (CATAPRES) 0.2 MG tablet Take 1 tablet (0.2 mg total) by mouth at bedtime. 30 tablet 0   FLUoxetine (PROZAC) 20 MG capsule Take 2 capsules (40 mg total) by mouth daily. 60 capsule 0   Fremanezumab-vfrm (AJOVY) 225 MG/1.5ML SOSY Inject 225 mg into the skin every 28 (twenty-eight) days.     pantoprazole (PROTONIX) 40 MG tablet Take 1 tablet (40 mg total) by mouth 2 (two) times daily for 56 days, THEN 1 tablet (40 mg total) daily.  1   rizatriptan (MAXALT) 10 MG tablet Take 10 mg by mouth See admin instructions. Take one tablet (10 mg) by mouth at onset of migraine headache, may repeat in 2 hours if needed     Vitamin D, Ergocalciferol, (DRISDOL) 1.25 MG (50000 UNIT) CAPS capsule Take 50,000 Units by mouth every 14 (fourteen) days.     No current facility-administered medications on file prior to visit.     Allergies: Allergies  Allergen Reactions   Latex Rash    Family History: Family History  Problem Relation Age of Onset   Migraines Mother      Past Medical History: Past Medical History:  Diagnosis Date   Anxiety    GERD (gastroesophageal reflux disease)    History of hiatal hernia    Hyperlipidemia    Hypertension    Migraine     Past Surgical History Past Surgical History:  Procedure Laterality Date   BIOPSY  01/10/2022   Procedure: BIOPSY;  Surgeon: Kathi Der, MD;  Location: [facility];  Service: Gastroenterology;;   CESAREAN SECTION     x 2   CHOLECYSTECTOMY N/A 06/23/2018   Procedure:  LAPAROSCOPIC CHOLECYSTECTOMY;  Surgeon: Harriette Bouillon, MD;  Location: [facility];  Service: General;  Laterality: N/A;   ESOPHAGOGASTRODUODENOSCOPY     ESOPHAGOGASTRODUODENOSCOPY N/A 01/10/2022   Procedure: ESOPHAGOGASTRODUODENOSCOPY (EGD);  Surgeon: Kathi Der, MD;  Location: [facility];  Service: Gastroenterology;  Laterality: N/A;   LAPAROSCOPIC HYSTERECTOMY      Social History: Social History   Tobacco Use   Smoking status: Never   Smokeless tobacco: Never  Vaping Use   Vaping status: Never Used  Substance Use Topics   Alcohol use: Yes    Comment: occ   Drug use: No     ROS: Negative for fevers, chills. Positive for headaches. All other systems reviewed and negative unless stated otherwise in HPI.   Physical Exam:   Vital Signs: BP (!) 157/103 (BP Location: Left Arm, Patient Position: Sitting, Cuff Size: Normal)   Pulse 60   Ht 5\' 8"  (1.727 m)   Wt 222 lb 12.8 oz (101.1 kg)   BMI 33.88 kg/m  GENERAL: well appearing,in no acute distress,alert SKIN:  Color, texture, turgor normal. No rashes or lesions HEAD:  Normocephalic/atraumatic. CV:  RRR RESP: Normal respiratory effort MSK: no tenderness to palpation over occiput, neck, or shoulders  NEUROLOGICAL: Mental Status: Alert, oriented to person, place and time,Follows commands Cranial Nerves: PERRL, visual fields intact to confrontation, extraocular movements intact, facial sensation intact, no facial droop or ptosis, hearing grossly intact, no dysarthria Motor: muscle strength 5/5 both upper and lower extremities,no drift, normal tone Reflexes: 2+ throughout Sensation: intact to light touch all 4 extremities Coordination: Finger-to- nose-finger intact bilaterally Gait: normal-based   IMPRESSION: 57 year old female with a history of PTSD who presents for evaluation of migraines. Will try Emgality for prevention as she had some improvement with Ajovy in the past. Will increase zonisamide dose to 300 mg at  bedtime as she has noticed mild improvement with this medication as well. Will start Zomig for rescue. Discussed future treatment options including Botox and Vyepti if her headaches do not improve.  PLAN: -Prevention: Start Emgality 120 mg monthly. Increase zonisamide to 300 mg QHS -Rescue: Start Zomig 5 mg PRN for rescue -Next steps: consider vyepti, botox, increasing gabapentin   I spent a total of 46 minutes chart reviewing and counseling the patient. Headache education was done. Discussed treatment options including preventive and acute medications. Discussed medication overuse headache and to limit use of acute treatments to no more than 2 days/week or 10 days/month. Discussed medication side effects, adverse reactions and drug interactions. Written educational materials and patient instructions outlining all of the above were given.  Follow-up: 4 months   Ocie Doyne, MD 04/14/2023   9:10 AM

## 2023-04-14 ENCOUNTER — Ambulatory Visit: Payer: BC Managed Care – PPO | Admitting: Psychiatry

## 2023-04-14 ENCOUNTER — Encounter: Payer: Self-pay | Admitting: Psychiatry

## 2023-04-14 VITALS — BP 157/103 | HR 60 | Ht 68.0 in | Wt 222.8 lb

## 2023-04-14 DIAGNOSIS — G43E19 Chronic migraine with aura, intractable, without status migrainosus: Secondary | ICD-10-CM

## 2023-04-14 MED ORDER — EMGALITY 120 MG/ML ~~LOC~~ SOAJ: 2.0000 | Freq: Once | SUBCUTANEOUS | 0 refills | Status: AC

## 2023-04-14 MED ORDER — ZOLMITRIPTAN 5 MG PO TABS: 5.0000 mg | ORAL_TABLET | ORAL | 6 refills | Status: AC | PRN

## 2023-04-14 MED ORDER — ZONISAMIDE 100 MG PO CAPS: 300.0000 mg | ORAL_CAPSULE | Freq: Every day | ORAL | 6 refills | Status: DC

## 2023-04-14 MED ORDER — EMGALITY 120 MG/ML ~~LOC~~ SOAJ: 1.0000 | SUBCUTANEOUS | 6 refills | Status: DC

## 2023-04-22 ENCOUNTER — Other Ambulatory Visit (HOSPITAL_COMMUNITY): Payer: Self-pay

## 2023-04-22 ENCOUNTER — Telehealth: Payer: Self-pay

## 2023-04-22 NOTE — Telephone Encounter (Signed)
Pharmacy Patient Advocate Encounter   Received notification from CoverMyMeds that prior authorization for Emgality 120MG /ML auto-injectors (migraine) is required/requested.   Insurance verification completed.   The patient is insured through [facility] .   Per test claim: PA required; PA submitted to [facility] via CoverMyMeds Key/confirmation #/EOC [facility] Status is pending

## 2023-04-27 ENCOUNTER — Other Ambulatory Visit (HOSPITAL_COMMUNITY): Payer: Self-pay

## 2023-04-27 NOTE — Telephone Encounter (Signed)
Pharmacy Patient Advocate Encounter  Received notification from [facility] that Prior Authorization for Emgality 120MG /ML auto-injectors (migraine) has been APPROVED from 04/22/2023 to 07/23/2023. Ran test claim, Copay is $30 per 30DS/1ML. This test claim was processed through [facility]- copay amounts may vary at other pharmacies due to pharmacy/plan contracts, or as the patient moves through the different stages of their insurance plan.   PA #/Case ID/Reference #: [id number]

## 2023-04-29 NOTE — Telephone Encounter (Signed)
Pt has called to report that within the last 20 mins she has been informed that in spite of the information from  Pharmacy Patient Advocate Encounter  On 8-12 at 9:35 am she is being told that yet another PA is needed before the Galcanezumab-gnlm [facility]) 120 MG/ML SOAJ will be filled

## 2023-07-02 ENCOUNTER — Other Ambulatory Visit (HOSPITAL_COMMUNITY): Payer: Self-pay

## 2023-07-02 ENCOUNTER — Telehealth: Payer: Self-pay

## 2023-07-02 NOTE — Telephone Encounter (Signed)
LVM for pt to call back about her Emgality.

## 2023-07-02 NOTE — Telephone Encounter (Signed)
   PT is up for PA renewal-current renewal expires on 07/23/2023-PT has appointment but after renewal date-Please advise

## 2023-07-02 NOTE — Telephone Encounter (Signed)
Pharmacy Patient Advocate Encounter   Received notification from CoverMyMeds that prior authorization for Emgality 120MG /ML auto-injectors (migraine) is required/requested.   Insurance verification completed.   The patient is insured through [facility] .   Per test claim: PA required; PA submitted to [facility] via CoverMyMeds Key/confirmation #/[id number] Status is pending

## 2023-07-02 NOTE — Telephone Encounter (Signed)
Took call from phone staff and spoke with pt. Confirmed she is taking Emgality. She took 2 pens (loading dose) 04/2023 and 1 pen 05/2023. She is due for next one soon.  Currently getting about 1-2 migraines per week. Some weeks, she only has 1 per week. Since starting [facility] 04/2023, she feels it has been beneficial. Severity improved, frequency has not. However, she feels Emgality has been more effective than previous CGRP meds (has tried/failed: Qulipta, Aimovig, Ajovy)  Also tried/failed:  Lamictal Zonisamide  Gabapentin  Losartan  Amlodipine metoprolol Hydroxyzine Topamax Zonisamide Diamox Fluoxetine Duloxetine Occipital nerve block  Headache days per month: 0  Baseline migraines prior to starting Emgality: 30 Now getting 8 per month.

## 2023-07-02 NOTE — Telephone Encounter (Signed)
Appears pt may not be taking. Pt last refilled Emgality 04/14/23 qty 1ml and 04/28/23 qty 2ml.  Last seen 04/14/23 and has next appt with Dr. Lucia Gaskins 08/11/23

## 2023-07-03 ENCOUNTER — Other Ambulatory Visit (HOSPITAL_COMMUNITY): Payer: Self-pay

## 2023-07-03 NOTE — Telephone Encounter (Signed)
Pharmacy Patient Advocate Encounter  Received notification from [facility] that Prior Authorization for Emgality 120MG /ML auto-injectors (migraine) has been APPROVED from 07/02/2023 to 07/01/2024. Ran test claim, Copay is $30.00. This test claim was processed through [facility]- copay amounts may vary at other pharmacies due to pharmacy/plan contracts, or as the patient moves through the different stages of their insurance plan.   PA #/Case ID/Reference #: [id number]

## 2023-08-06 ENCOUNTER — Other Ambulatory Visit (HOSPITAL_COMMUNITY): Payer: Self-pay | Admitting: Gastroenterology

## 2023-08-06 DIAGNOSIS — R112 Nausea with vomiting, unspecified: Secondary | ICD-10-CM

## 2023-08-11 ENCOUNTER — Encounter: Payer: Self-pay | Admitting: Neurology

## 2023-08-11 ENCOUNTER — Telehealth: Payer: Self-pay | Admitting: Neurology

## 2023-08-11 ENCOUNTER — Ambulatory Visit: Payer: BC Managed Care – PPO | Admitting: Neurology

## 2023-08-11 VITALS — BP 131/81 | HR 64 | Ht 67.25 in | Wt 224.0 lb

## 2023-08-11 DIAGNOSIS — G43711 Chronic migraine without aura, intractable, with status migrainosus: Secondary | ICD-10-CM

## 2023-08-11 MED ORDER — TIMOLOL HEMIHYDRATE 0.5 % OP SOLN: OPHTHALMIC | 12 refills | Status: AC

## 2023-08-11 MED ORDER — NURTEC 75 MG PO TBDP: 75.0000 mg | ORAL_TABLET | Freq: Every day | ORAL | 0 refills | Status: DC | PRN

## 2023-08-11 NOTE — Telephone Encounter (Signed)
Received verbal order from Dr. Lucia Gaskins to begin Botox auth for patient. I submitted a benefit verification and enrolled her in the Botox Savings Program. She is already scheduled with Dr. Lucia Gaskins for 12/19 @ 8:00 am.  Cynthia Tapia

## 2023-08-11 NOTE — Patient Instructions (Addendum)
Mechanism: -Timolol is a beta-blocker, which works by constricting blood vessels in the brain, potentially reducing the pain associated with a migraine.  Application: -When experiencing a migraine, one drop of 0.5% timolol eye drops is instilled in each eye.  Evidence: -Clinical trials have demonstrated that timolol eye drops can significantly reduce migraine pain scores compared to placebo when used at the start of a migraine attack.  Benefits: -Rapid delivery: Eye drops provide quick absorption and potential pain relief within a short time frame.  Generally well-tolerated: Most side effects are mild and related to eye irritation  Magnesium doses depending on which one you take(pick one) and other OTC medications you can try: 200 mg mag citrate 1-2x daily OR 400 mg mag oxide 1-2x daily OR Mag sulfate 400 to 600 mg daily(can   Other optional vitamin supplementation that may work includes 1000 international units AND vitamin D3 daily AND 500 to 1000 mg of B12 daily AND Riboflavin 400 mg a day AND 150 to 300 mg co-Q10 daily  When better try Nurtec at onset - can take it preventatively or when having a migraine: samples once daily as needed and can pretreat migraines, no rebound Start Botox  Discuss Spravato and Transcranial magnetic stimulation (TMS) is a non-invasive treatment that uses magnetic pulses to stimulate the brain and treat a variety of conditions:  Depression: TMS is an FDA-approved treatment for major depressive disorder (MDD) when other treatments, like antidepressants, have not been effective. TMS can help relieve depression by restoring the balance of neurotransmitters in the brain.  OCD: TMS can be used to treat obsessive-compulsive disorder (OCD).  Migraines: TMS can help treat migraines.  Smoking cessation: TMS can help people stop smoking after other treatments have not been successful.   OnabotulinumtoxinA Injection (Medical Use) What is this  medication? ONABOTULINUMTOXINA (o na BOTT you lye num tox in eh) treats severe muscle spasms. It may also be used to prevent migraine headaches. It can treat excessive sweating when other medications do not work well enough. This medicine may be used for other purposes; ask your health care provider or pharmacist if you have questions. COMMON BRAND NAME(S): Botox What should I tell my care team before I take this medication? They need to know if you have any of these conditions: Breathing problems Cerebral palsy spasms Difficulty urinating Heart problems History of surgery where this medication is going to be used Infection at the site where this medication is going to be used Myasthenia gravis or other neurologic disease Nerve or muscle disease Surgery plans Take medications that treat or prevent blood clots Thyroid problems An unusual or allergic reaction to botulinum toxin, albumin, other medications, foods, dyes, or preservatives Pregnant or trying to get pregnant Breast-feeding How should I use this medication? This medication is for injected into a muscle. It is given by your care team in a hospital or clinic setting. A special MedGuide will be given to you before each treatment. Be sure to read this information carefully each time. Talk to your care team about the use of this medication in children. While this medication may be prescribed for children as young as 2 years for selected conditions, precautions do apply. Overdosage: If you think you have taken too much of this medicine contact a poison control center or emergency room at once. NOTE: This medicine is only for you. Do not share this medicine with others. What if I miss a dose? This does not apply. What may interact with this medication?  Aminoglycoside antibiotics, such as gentamicin, neomycin, tobramycin Muscle relaxants Other botulinum toxin injections This list may not describe all possible interactions. Give your  health care provider a list of all the medicines, herbs, non-prescription drugs, or dietary supplements you use. Also tell them if you smoke, drink alcohol, or use illegal drugs. Some items may interact with your medicine. What should I watch for while using this medication? Visit your care team for regular check ups. This medication will cause weakness in the muscle where it is injected. Tell your care team if you feel unusually weak in other muscles. Get medical help right away if you have problems with breathing, swallowing, or talking. This medication might make your eyelids droop or make you see blurry or double. If you have weak muscles or trouble seeing do not drive a car, use machinery, or do other dangerous activities. This medication contains albumin from human blood. It may be possible to pass an infection in this medication, but no cases have been reported. Talk to your care team about the risks and benefits of this medication. If your activities have been limited by your condition, go back to your regular routine slowly after treatment with this medication. What side effects may I notice from receiving this medication? Side effects that you should report to your care team as soon as possible: Allergic reactions--skin rash, itching, hives, swelling of the face, lips, tongue, or throat Dryness or irritation of the eyes, eye pain, change in vision, sensitivity to light Infection--fever, chills, cough, sore throat, wounds that don't heal, pain or trouble when passing urine, general feeling of discomfort or being unwell Spread of botulinum toxin effects--unusual weakness or fatigue, blurry or double vision, trouble swallowing, hoarseness or trouble speaking, trouble breathing, loss of bladder control Trouble passing urine Side effects that usually do not require medical attention (report these to your care team if they continue or are bothersome): Dry mouth Eyelid  drooping Fatigue Headache Pain, redness, or irritation at injection site This list may not describe all possible side effects. Call your doctor for medical advice about side effects. You may report side effects to FDA at 1-800-FDA-1088. Where should I keep my medication? This medication is given in a hospital or clinic and will not be stored at home. NOTE: This sheet is a summary. It may not cover all possible information. If you have questions about this medicine, talk to your doctor, pharmacist, or health care provider.  2024 Elsevier/Gold Standard (2021-08-29 00:00:00)    Discussed:  There is increased risk for stroke in women with migraine with aura and a contraindication for the combined contraceptive pill for use by women who have migraine with aura. The risk for women with migraine without aura is lower. However other risk factors like smoking are far more likely to increase stroke risk than migraine. There is a recommendation for no smoking and for the use of OCPs without estrogen such as progestogen only pills particularly for women with migraine with aura.Marland Kitchen People who have migraine headaches with auras may be 3 times more likely to have a stroke caused by a blood clot, compared to migraine patients who don't see auras. Women who take hormone-replacement therapy may be 30 percent more likely to suffer a clot-based stroke than women not taking medication containing estrogen. Other risk factors like smoking and high blood pressure may be  much more important. And stroke is still a rare complication due to migraine aura and is controversial and lower doses may not  cause a risk.   Timolol Eye Solution What is this medication? TIMOLOL (TIM oh lol) treats conditions with increased pressure of the eye, such as glaucoma. It works by decreasing the amount of fluid in the eye, which helps lower eye pressure. It belongs to a group of medications called beta blockers. This medicine may be used for  other purposes; ask your health care provider or pharmacist if you have questions. COMMON BRAND NAME(S): Betimol, Istalol, Timoptic, Timoptic Ocudose, Timoptic Ocumeter What should I tell my care team before I take this medication? They need to know if you have any of these conditions: Diabetes Having surgery Heart or blood vessel conditions, such as slow heartbeat, heart failure, heart block Lung or breathing disease, such as asthma or COPD Myasthenia gravis Thyroid disease Wear contact lenses An unusual or allergic reaction to timolol, other medications, foods, dyes, or preservatives Pregnant or trying to get pregnant Breastfeeding How should I use this medication? This medication is only for use in the eye. Do not take by mouth. Follow the directions on the prescription label. If you are using a unit dose container, use immediately after opening and throw away right after use. Wash hands before and after use. Tilt your head back slightly and pull your lower eyelid down with your index finger to form a pouch. Try not to touch the tip of the dropper to your eye, fingertips, or any other surface. Squeeze the prescribed number of drops into the pouch. Close the eye for a few moments to spread the drops and apply gentle finger pressure to the inner corner of the eye for 1 to 2 minutes. Use your doses at regular intervals. Do not use your medication more often than directed. Do not stop using except on the advice of your care team. Talk to your care team regarding the use of this medication in children. While this medication may be prescribed for children as young as 2 years for selected conditions, precautions do apply. Overdosage: If you think you have taken too much of this medicine contact a poison control center or emergency room at once. NOTE: This medicine is only for you. Do not share this medicine with others. What if I miss a dose? If you miss a dose, use it as soon as you can. If it is  almost time for your next dose, use only that dose. Do not use double or extra doses. What may interact with this medication? Do not use other eye products without talking to your care team. This list may not describe all possible interactions. Give your health care provider a list of all the medicines, herbs, non-prescription drugs, or dietary supplements you use. Also tell them if you smoke, drink alcohol, or use illegal drugs. Some items may interact with your medicine. What should I watch for while using this medication? Visit your care team for regular checks on your progress. Tell your care team if your symptoms do not start to get better or if they get worse. If you are going to need surgery or a procedure, tell your care team that you are using this medication. Remove soft contact lenses before using these eye drops. You can put your contact lenses back in 15 minutes after you use the eye drops. What side effects may I notice from receiving this medication? Side effects that you should report to your care team as soon as possible: Allergic reactions--skin rash, itching, hives, swelling of the face, lips, tongue, or throat New  or worsening eye pain, redness, irritation, or discharge Side effects that usually do not require medical attention (report to your care team if they continue or are bothersome): Blurry vision Headache Irritation at application site This list may not describe all possible side effects. Call your doctor for medical advice about side effects. You may report side effects to FDA at 1-800-FDA-1088. Where should I keep my medication? Keep out of the reach of children and pets. Store at room temperature between 15 and 30 degrees C (59 and 86 degrees F). Protect from light. Keep container tightly closed. After opening the foil wrap of unit dose eye drops (Ocudose), use within 1 month. Throw away any unused medication after the expiration date. NOTE: This sheet is a summary.  It may not cover all possible information. If you have questions about this medicine, talk to your doctor, pharmacist, or health care provider.  2024 Elsevier/Gold Standard (2022-09-01 00:00:00)  Is taking magnesium good for migraine? The ideal medication for prevention and treatment of migraine would have no side effects, no risk, would be safe in pregnancy, as well as be highly effective while remaining inexpensive. Of course, no such medication exists, but magnesium comes closer than many interventions on all these fronts.  What form of magnesium is best for migraine? Magnesium oxide is frequently used in pill form to prevent migraine, usually at a dose of 400-600 mg per day. Acutely, it can be dosed in pill form at the same dosage or given intravenously as magnesium sulfate at 1-2 gm. The most frequent side effect is diarrhea, which can be helpful in those prone to constipation. Diarrhea and abdominal cramping that is sometimes experienced is dose-responsive, such that a lower dose or decreasing the frequency of intake usually takes care of the problem.  Magnesium oxide in doses up to 400 mg is pregnancy category A, which means it can be used safely in pregnancy. Magnesium sulfate, typically given intravenously, now carries a warning related to bone thinning seen in the developing fetus when used longer than 5-7 days in a row. This was discovered in the context of high doses being given to pregnant women to prevent preterm labor.  The most substantial evidence for magnesium's effectiveness is in patients who have or have had aura with their migraine. It is believed magnesium may prevent the wave of brain signaling, called cortical spreading depression, which produces the visual and sensory changes in the common forms of aura. Other mechanisms of magnesium action include improved platelet function and decreased release or blocking of pain transmitting chemicals in the brain such as Substance P and  glutamate. Magnesium may also prevent the narrowing of brain blood vessels caused by the neurotransmitter serotonin.  Daily oral magnesium has also been shown to prevent menstrually related migraine, especially in those with premenstrual migraine. This means that preventive use can target those with aura or those with menstrually related migraine, even for those with irregular cycles.  It is challenging to measure magnesium levels accurately, as levels in the bloodstream may represent only 2% of total body stores, with the rest of magnesium stored in the bones or within cells. Most importantly, simple magnesium blood levels do not accurately measure magnesium levels in the brain. This has led to uncertainty concerning whether correcting a low magnesium level is necessary in treatment or whether magnesium effectiveness is even related to low blood levels in the first place. Measurement of ionized magnesium or red blood cell magnesium levels is thought to be more  accurate, but these laboratory tests are more difficult and expensive to obtain.  Can low magnesium cause migraine? Because magnesium may not be accurately measured, low magnesium in the brain can be difficult to prove. Those prone to low magnesium include people with heart disease, diabetes, alcoholism, and those on diuretics for blood pressure. There is some evidence that migraineurs may have lower brain magnesium levels either from decreased absorption of it in food, a genetic tendency to low brain magnesium, or from excreting it from the body to a greater degree than non-migraineurs. Studies of migraineurs have found low levels of brain and spinal fluid magnesium in between migraine attacks.  In 2012, the American Head[employer] Society and the American Academy of Neurology reviewed the studies on medications used for migraine prevention and gave magnesium a Level B rating; that is, it is probably effective and should be considered for patients  requiring migraine preventive therapy. Because of its safety profile and the lack of serious side effects, magnesium is often chosen as a preventive strategy either alone or with other preventive medications.  Magnesium has also been studied for the acute, as-needed treatment of severe, difficult-to-treat migraine. Magnesium sulfate given intravenously was most effective in those with a history of migraine with aura. In those without a history of aura, no difference was seen in immediate pain relief or nausea relief by magnesium. Still, there was less light and noise sensitivity after the infusion.  Magnesium oxide, in tablet form, is inexpensive, does not require a prescription, and may be considered reasonable prevention in those who have a history of aura, menstrually related migraine, no health insurance, or who may become pregnant. Because of the excellent safety profile of magnesium, any patient who has frequent migraine and is considering a preventive strategy to reduce the frequency or severity of their headaches may want to consider this option and discuss it with their physician.   https://americanmigrainefoundation.org/resource-library/magnesium/#:~:text=The%50most%20substantial%20evidence%20for,the%20common%20forms%20of%20aura.  Micheline Chapman, MD  [facility]  Rimegepant Disintegrating Tablets What is this medication? RIMEGEPANT (ri ME je pant) prevents and treats migraines. It works by blocking a substance in the body that causes migraines. This medicine may be used for other purposes; ask your health care provider or pharmacist if you have questions. COMMON BRAND NAME(S): NURTEC ODT What should I tell my care team before I take this medication? They need to know if you have any of these conditions: Kidney disease Liver disease An unusual or allergic reaction to rimegepant, other medications, foods, dyes, or preservatives Pregnant or trying to get  pregnant Breast-feeding How should I use this medication? Take this medication by mouth. Take it as directed on the prescription label. Leave the tablet in the sealed pack until you are ready to take it. With dry hands, open the pack and gently remove the tablet. If the tablet breaks or crumbles, throw it away. Use a new tablet. Place the tablet in the mouth and allow it to dissolve. Then, swallow it. Do not cut, crush, or chew this medication. You do not need water to take this medication. Talk to your care team about the use of this medication in children. Special care may be needed. Overdosage: If you think you have taken too much of this medicine contact a poison control center or emergency room at once. NOTE: This medicine is only for you. Do not share this medicine with others. What if I miss a dose? This does not apply. This medication is not for regular use. What may  interact with this medication? Certain medications for fungal infections, such as fluconazole, itraconazole Rifampin This list may not describe all possible interactions. Give your health care provider a list of all the medicines, herbs, non-prescription drugs, or dietary supplements you use. Also tell them if you smoke, drink alcohol, or use illegal drugs. Some items may interact with your medicine. What should I watch for while using this medication? Visit your care team for regular checks on your progress. Tell your care team if your symptoms do not start to get better or if they get worse. What side effects may I notice from receiving this medication? Side effects that you should report to your care team as soon as possible: Allergic reactions--skin rash, itching, hives, swelling of the face, lips, tongue, or throat Side effects that usually do not require medical attention (report to your care team if they continue or are bothersome): Nausea Stomach pain This list may not describe all possible side effects. Call your  doctor for medical advice about side effects. You may report side effects to FDA at 1-800-FDA-1088. Where should I keep my medication? Keep out of the reach of children and pets. Store at room temperature between 20 and 25 degrees C (68 and 77 degrees F). Get rid of any unused medication after the expiration date. To get rid of medications that are no longer needed or have expired: Take the medication to a medication take-back program. Check with your pharmacy or law enforcement to find a location. If you cannot return the medication, check the label or package insert to see if the medication should be thrown out in the garbage or flushed down the toilet. If you are not sure, ask your care team. If it is safe to put it in the trash, take the medication out of the container. Mix the medication with cat litter, dirt, coffee grounds, or other unwanted substance. Seal the mixture in a bag or container. Put it in the trash. NOTE: This sheet is a summary. It may not cover all possible information. If you have questions about this medicine, talk to your doctor, pharmacist, or health care provider.  2024 Elsevier/Gold Standard (2021-10-23 00:00:00)

## 2023-08-11 NOTE — Progress Notes (Signed)
Referring:  Tally Joe, MD [address] [place],  [place] [address]  PCP: Tally Joe, MD  08/11/2023: She was having 30 migraine days a month after emgality she has 8 severe migraines (without aura, no medication overuse), a month and 15 total headache days a month. The severe migraines "takes her out" an 8-9/10 and she has to stay home in bed can last 24 hours. Zomig doesn't help but she takes prazosin and that helps. Bernita Raisin did not work, No pailledema so not treated for IDIOPATHIC INTRACRANIAL HYPERTENSION anymore and opening physician was only 21 not technically in the requirements for IDIOPATHIC INTRACRANIAL HYPERTENSION . She has occassional aura but not the severe migraines and these 8 severe migraines a month WITHOUT aura, no medication overuse,   Neurology was asked to evaluate Ethelreda Klitzke, a 57 year old female for a chief complaint of headaches.  Our recommendations of care will be communicated by shared medical record.    CC:  headaches  08/11/2023:  Patient of Dr. Delena Bali, transition of care to Dr. Lucia Gaskins.  I reviewed Dr. Stan Head notes, MRI of the brain in April 2016 showed a partially empty sella she previously underwent LP which showed borderline elevated opening pressure 21 but she did not have the papilledema at her last ophthalmology exam 6 months ago.  At last appointment she stated she had no headaches in the month but she had 30 out of 30 migraine headaches.  She has been on at least 15 prior preventatives for my colleagues note in addition to [place] and the next steps were Botox or Vyepti.  Reviewed images and prior labs:  CLINICAL DATA:  57 year old hypertensive hyperlipidemic female with migraine headaches. Recent elevated blood pressure which upon taking the medication experienced slurred speech. Workup for possible pseudotumor. Initial encounter.   EXAM: MRI HEAD WITHOUT AND WITH CONTRAST   TECHNIQUE: Multiplanar, multiecho pulse  sequences of the brain and surrounding structures were obtained without and with intravenous contrast.   CONTRAST:  20mL MULTIHANCE GADOBENATE DIMEGLUMINE 529 MG/ML IV SOLN   COMPARISON:  12/18/2014 head CT.  No comparison brain MR.   FINDINGS: No acute infarct.   No intracranial hemorrhage.   No intracranial mass or abnormal enhancement.   Sella is partially empty and slightly expanded. There is also minimal flattening of the posterior aspect the left globe and slightly prominent peri optic spaces. These findings have been described in patients with pseudotumor cerebri   Exophthalmos.   Major intracranial vascular structures are patent.   Cervical medullary junction and pineal region unremarkable.   IMPRESSION: No acute infarct.   No intracranial mass or abnormal enhancement.   Sella is partially empty and slightly expanded. There is also minimal flattening of the posterior aspect the left globe and slightly prominent peri optic spaces. These findings have been described in patients with pseudotumor cerebri.   Exophthalmos.     Latest Ref Rng & Units 01/11/2022    2:22 AM 01/10/2022    1:42 AM 06/23/2018    9:02 AM  CBC  WBC 4.0 - 10.5 K/uL 7.8  6.9  6.6   Hemoglobin 12.0 - 15.0 g/dL 25.3  66.4  40.3   Hematocrit 36.0 - 46.0 % 37.0  39.2  43.1   Platelets 150 - 400 K/uL 278  296  372       Latest Ref Rng & Units 01/11/2022    2:22 AM 01/10/2022   10:11 AM 01/10/2022    1:42  AM  CMP  Glucose 70 - 99 mg/dL 161  98  096   BUN 6 - 20 mg/dL 11  14  20    Creatinine 0.44 - 1.00 mg/dL 0.45  4.09  8.11   Sodium 135 - 145 mmol/L 141  142  139   Potassium 3.5 - 5.1 mmol/L 3.9  3.4  3.6   Chloride 98 - 111 mmol/L 109  111  107   CO2 22 - 32 mmol/L 24  25  25    Calcium 8.9 - 10.3 mg/dL 8.7  8.2  9.2   Total Protein 6.5 - 8.1 g/dL  6.2  6.9   Total Bilirubin 0.3 - 1.2 mg/dL  0.4  0.6   Alkaline Phos 38 - 126 U/L  70  81   AST 15 - 41 U/L  14  19   ALT 0 - 44 U/L  14  16      Patient complains of symptoms per HPI as well as the following symptoms: PTSD . Pertinent negatives and positives per HPI. All others negative  HPI:  04/14/2023: Patient of Dr. Delena Bali, transition of care to Dr. Lucia Gaskins Medical co-morbidities: PTSD  The patient presents for evaluation of headaches. States she has had migraines "all her life". Migraines are associated with visual aura, numbness in her face, aphasia, and confusion. She also has associated photophobia, phonophobia, and nausea. She has a constant lower level headache which progresses to a more severe migraine nearly every day. Has an aura once per week. Currently takes Qulipta for prevention which takes the edge off of her headache but has not reduced the frequency. She takes frovatriptan for rescue which she has not found helpful. States Ajovy was helpful in the past but wore off 1 week before her next shot. Imitrex helped for rescue but caused side effects.  MRI brain 12/18/14 showed a partially empty sella. She previously underwent LP which showed borderline elevated opening pressure of 21. She did not have papilledema at her last ophthalmology exam 6 months ago.  She is in treatment for PTSD which has helped with her migraines. She is very tearful during this visit.  Headache History: Onset: several years ago Aura: visual aura, numbness, aphasia, confusion Associated Symptoms:  Photophobia: yes  Phonophobia: yes  Nausea: yes Worse with activity?: yes Duration of headaches: constant  Migraine days per month: 30 Headache free days per month: 0  Yeah yeah head and neck thank you Limited current Treatment: Abortive Frovatriptan 2.5 mg PRN  Preventative Qulipta 60 mg daily Lamictal 100 mg daily Zonisamide 200 mg QHS Gabapentin 1000 mg daily  Prior Therapies                                 Preventive: Losartan   Amlodipine metoprolol Hydroxyzine Lamictal Topamax Zonisamide Diamox Fluoxetine Duloxetine Gabapentin Aimovig Ajovy  Qulipta Occipital nerve block  Rescue: Frovatriptan Rizatriptan - lack of efficacy Sumatriptan - helped but caused side effects Ubrelvy - lack of efficacy tizanidine  LABS: CBC    Component Value Date/Time   WBC 7.8 01/11/2022 0222   RBC 4.22 01/11/2022 0222   HGB 11.8 (L) 01/11/2022 0222   HCT 37.0 01/11/2022 0222   PLT 278 01/11/2022 0222   MCV 87.7 01/11/2022 0222   MCH 28.0 01/11/2022 0222   MCHC 31.9 01/11/2022 0222   RDW 13.5 01/11/2022 0222   LYMPHSABS 2.2 01/10/2022 0142   MONOABS 0.6 01/10/2022 0142  EOSABS 0.2 01/10/2022 0142   BASOSABS 0.0 01/10/2022 0142      Latest Ref Rng & Units 01/11/2022    2:22 AM 01/10/2022   10:11 AM 01/10/2022    1:42 AM  CMP  Glucose 70 - 99 mg/dL 478  98  295   BUN 6 - 20 mg/dL 11  14  20    Creatinine 0.44 - 1.00 mg/dL 6.21  3.08  6.57   Sodium 135 - 145 mmol/L 141  142  139   Potassium 3.5 - 5.1 mmol/L 3.9  3.4  3.6   Chloride 98 - 111 mmol/L 109  111  107   CO2 22 - 32 mmol/L 24  25  25    Calcium 8.9 - 10.3 mg/dL 8.7  8.2  9.2   Total Protein 6.5 - 8.1 g/dL  6.2  6.9   Total Bilirubin 0.3 - 1.2 mg/dL  0.4  0.6   Alkaline Phos 38 - 126 U/L  70  81   AST 15 - 41 U/L  14  19   ALT 0 - 44 U/L  14  16      IMAGING:  MRI brain 2016:   Sella is partially empty and slightly expanded. There is also minimal flattening of the posterior aspect the left globe and slightly prominent peri optic spaces. These findings have been described in patients with pseudotumor cerebri.  Imaging independently reviewed on August 16, 2023   Current Outpatient Medications on File Prior to Visit  Medication Sig Dispense Refill   FLUoxetine (PROZAC) 40 MG capsule Take 40 mg by mouth 2 (two) times daily.     Galcanezumab-gnlm (EMGALITY) 120 MG/ML SOAJ Inject 1 Pen into the skin every 30 (thirty) days. 1.12 mL 6   iron  polysaccharides (NIFEREX) 150 MG capsule Take 150 mg by mouth every morning.     lamoTRIgine (LAMICTAL) 25 MG tablet Take 1 tablet (25 mg total) by mouth daily.     lithium carbonate 150 MG capsule 1 capsule Orally Once a day     olmesartan-hydrochlorothiazide (BENICAR HCT) 20-12.5 MG tablet 1 tablet Orally Once a day for 90 days     pantoprazole (PROTONIX) 40 MG tablet 2 (two) times daily.     pravastatin (PRAVACHOL) 20 MG tablet Take 20 mg by mouth every morning.     prazosin (MINIPRESS) 2 MG capsule Take 2 mg by mouth. 2 mg night and 6 mg prn as needed     zolmitriptan (ZOMIG) 5 MG tablet Take 1 tablet (5 mg total) by mouth as needed for migraine. May repeat a dose in 2 hours if headache persists 10 tablet 6   zonisamide (ZONEGRAN) 100 MG capsule Take 3 capsules (300 mg total) by mouth at bedtime. (Patient taking differently: Take 400 mg by mouth at bedtime.) 90 capsule 6   No current facility-administered medications on file prior to visit.     Allergies: Allergies  Allergen Reactions   Latex Rash    Family History: Family History  Problem Relation Age of Onset   Migraines Mother      Past Medical History: Past Medical History:  Diagnosis Date   Anxiety    GERD (gastroesophageal reflux disease)    History of hiatal hernia    Hyperlipidemia    Hypertension    Migraine     Past Surgical History Past Surgical History:  Procedure Laterality Date   BIOPSY  01/10/2022   Procedure: BIOPSY;  Surgeon: Kathi Der, MD;  Location: [facility];  Service: Gastroenterology;;   CESAREAN SECTION     x 2   CHOLECYSTECTOMY N/A 06/23/2018   Procedure: LAPAROSCOPIC CHOLECYSTECTOMY;  Surgeon: Harriette Bouillon, MD;  Location: [facility];  Service: General;  Laterality: N/A;   ESOPHAGOGASTRODUODENOSCOPY     ESOPHAGOGASTRODUODENOSCOPY N/A 01/10/2022   Procedure: ESOPHAGOGASTRODUODENOSCOPY (EGD);  Surgeon: Kathi Der, MD;  Location: [facility];  Service: Gastroenterology;   Laterality: N/A;   LAPAROSCOPIC HYSTERECTOMY      Social History: Social History   Tobacco Use   Smoking status: Never   Smokeless tobacco: Never  Vaping Use   Vaping status: Never Used  Substance Use Topics   Alcohol use: Yes    Comment: occ   Drug use: No    Patient complains of symptoms per HPI as well as the following symptoms: none . Pertinent negatives and positives per HPI. All others negative  Physical exam: Exam: Gen: NAD, conversant, well nourised, obese, well groomed                     CV: RRR, no MRG. No Carotid Bruits. No peripheral edema, warm, nontender Eyes: Conjunctivae clear without exudates or hemorrhage  Neuro: Detailed Neurologic Exam  Speech:    Speech is normal; fluent and spontaneous with normal comprehension.  Cognition:    The patient is oriented to person, place, and time;     recent and remote memory intact;     language fluent;     normal attention, concentration,     fund of knowledge Cranial Nerves:    The pupils are equal, round, and reactive to light. The fundi are normal and spontaneous venous pulsations are present. Visual fields are full to finger confrontation. Extraocular movements are intact. Trigeminal sensation is intact and the muscles of mastication are normal. The face is symmetric. The palate elevates in the midline. Hearing intact. Voice is normal. Shoulder shrug is normal. The tongue has normal motion without fasciculations.   Coordination:    Normal finger to nose and heel to shin. Normal rapid alternating movements.   Gait:    Heel-toe and tandem gait are normal.   Motor Observation:    No asymmetry, no atrophy, and no involuntary movements noted. Tone:    Normal muscle tone.    Posture:    Posture is normal. normal erect    Strength:    Strength is V/V in the upper and lower limbs.      Sensation: intact to LT     Reflex Exam:  DTR's:    Deep tendon reflexes in the upper and lower extremities are normal  bilaterally.   Toes:    The toes are downgoing bilaterally.   Clonus:    Clonus is absent.     IMPRESSION: Patient of Dr. Delena Bali, transition of care to Dr. Lucia Gaskins.  I reviewed Dr. Stan Head notes, MRI of the brain in April 2016 showed a partially empty sella she previously underwent LP which showed borderline elevated opening pressure 21 but she did not have the papilledema at her last ophthalmology exam 6 months ago.  At last appointment she stated she had no headaches in the month but she had 30 out of 30 migraine headaches.  She has been on at least 15 prior preventatives for my colleagues note in addition to [place] and the next steps were Botox or Vyepti.  This is a patient of Dr. Sherri Sear, her last plan was to start [facility] for prevention as she had some improvement with  Ajovy but is not helpful.  Dr. Imogene Burn also increase in the zonisamide to 300 mg at bedtime and started Zomig for rescue.  Discussed further options including Botox and Vyepti if her headaches do not improve.  It appears she also may have increase gabapentin.  -When experiencing a migraine, one drop of 0.5% timolol eye drops is instilled in each eye. Mechanism: -Timolol is a beta-blocker, which works by constricting blood vessels in the brain, potentially reducing the pain associated with a migraine.  Application: -When experiencing a migraine, one drop of 0.5% timolol eye drops is instilled in each eye.  Evidence: -Clinical trials have demonstrated that timolol eye drops can significantly reduce migraine pain scores compared to placebo when used at the start of a migraine attack.  Benefits: -Rapid delivery: Eye drops provide quick absorption and potential pain relief within a short time frame.  Generally well-tolerated: Most side effects are mild and related to eye irritation  Magnesium doses depending on which one you take(pick one) and other OTC medications you can try: 200 mg mag citrate 1-2x daily OR 400 mg  mag oxide 1-2x daily OR Mag sulfate 400 to 600 mg daily(can   Other optional vitamin supplementation that may work includes 1000 international units AND vitamin D3 daily AND 500 to 1000 mg of B12 daily AND Riboflavin 400 mg a day AND 150 to 300 mg co-Q10 daily  When better try Nurtec at onset - can take it preventatively or when having a migraine: samples once daily as needed and can pretreat migraines, no rebound Start Botox  Discuss Spravato and Transcranial magnetic stimulation (TMS) is a non-invasive treatment that uses magnetic pulses to stimulate the brain and treat a variety of conditions: NEEDS TO DISCUSS WITH PSUCHIATRIST. Depression: TMS is an FDA-approved treatment for major depressive disorder (MDD) when other treatments, like antidepressants, have not been effective. TMS can help relieve depression by restoring the balance of neurotransmitters in the brain. OCD: TMS can be used to treat obsessive-compulsive disorder (OCD). Migraines: TMS can help treat migraines. Smoking cessation: TMS can help people stop smoking after other treatments have not been successful.    Naomie Dean MD 08/16/2023   11:25 AM  I spent over 60 minutes of face-to-face and non-face-to-face time with patient on the  1. Chronic migraine without aura, with intractable migraine, so stated, with status migrainosus    diagnosis.  This included previsit chart review, lab review, study review, order entry, electronic health record documentation, patient education on the different diagnostic and therapeutic options, counseling and coordination of care, risks and benefits of management, compliance, or risk factor reduction

## 2023-08-11 NOTE — Telephone Encounter (Signed)
Faxed signed PA form along with OV notes to [facility].

## 2023-08-16 ENCOUNTER — Encounter: Payer: Self-pay | Admitting: Neurology

## 2023-08-17 ENCOUNTER — Encounter (HOSPITAL_COMMUNITY)
Admission: RE | Admit: 2023-08-17 | Discharge: 2023-08-17 | Disposition: A | Discharge status: Home / Self Care | Payer: BC Managed Care – PPO | Source: Ambulatory Visit | Attending: Gastroenterology | Admitting: Gastroenterology

## 2023-08-17 DIAGNOSIS — R112 Nausea with vomiting, unspecified: Secondary | ICD-10-CM | POA: Diagnosis present

## 2023-08-17 MED ORDER — TECHNETIUM TC 99M SULFUR COLLOID
2.0000 | Freq: Once | INTRAVENOUS | Status: AC | PRN
  Administered 2023-08-17: 2 via INTRAVENOUS

## 2023-08-17 MED ORDER — ONABOTULINUMTOXINA 200 UNITS IJ SOLR: INTRAMUSCULAR | 1 refills | Status: DC

## 2023-08-17 NOTE — Addendum Note (Signed)
Addended by: Bertram Savin on: 08/17/2023 11:03 AM   Modules accepted: Orders

## 2023-08-17 NOTE — Telephone Encounter (Signed)
Received notification of approval, please send rx to [facility]. Pt is already scheduled 12/19 with Dr. Lucia Gaskins.  Auth#: [id number] (08/11/23-01/26/24)

## 2023-08-17 NOTE — Telephone Encounter (Signed)
Botox prescription sent to [facility] in [place].

## 2023-09-03 ENCOUNTER — Ambulatory Visit: Payer: BC Managed Care – PPO | Admitting: Neurology

## 2023-09-03 DIAGNOSIS — G43711 Chronic migraine without aura, intractable, with status migrainosus: Secondary | ICD-10-CM | POA: Diagnosis not present

## 2023-09-03 DIAGNOSIS — R52 Pain, unspecified: Secondary | ICD-10-CM

## 2023-09-03 MED ORDER — ONDANSETRON 8 MG PO TBDP: 8.0000 mg | ORAL_TABLET | Freq: Three times a day (TID) | ORAL | 3 refills | Status: AC | PRN

## 2023-09-03 MED ORDER — ONABOTULINUMTOXINA 200 UNITS IJ SOLR
155.0000 [IU] | Freq: Once | INTRAMUSCULAR | Status: AC
  Administered 2023-09-03: 155 [IU] via INTRAMUSCULAR

## 2023-09-03 MED ORDER — ZAVZPRET 10 MG/ACT NA SOLN
10.0000 mg | Freq: Every day | NASAL | Status: DC | PRN
Start: 2023-09-03 — End: 2023-11-26

## 2023-09-03 MED ORDER — "BD SAFETYGLIDE SYRINGE/NEEDLE 25G X 1"" 3 ML MISC": 5 refills | Status: DC

## 2023-09-03 MED ORDER — PROMETHAZINE HCL 25 MG RE SUPP: 25.0000 mg | Freq: Four times a day (QID) | RECTAL | 11 refills | Status: DC | PRN

## 2023-09-03 MED ORDER — KETOROLAC TROMETHAMINE 60 MG/2ML IM SOLN: INTRAMUSCULAR | 4 refills | Status: AC

## 2023-09-03 NOTE — Progress Notes (Signed)
Consent Form Botulism Toxin Injection For Chronic Migraine   09/03/2023 +a. Gave zavzpret and toradol. Ondansetron and phenergan for when vomiting not to take together. Discussed nerivio try and prescribed.   Reviewed orally with patient, additionally signature is on file:  Botulism toxin has been approved by the Federal drug administration for treatment of chronic migraine. Botulism toxin does not cure chronic migraine and it may not be effective in some patients.  The administration of botulism toxin is accomplished by injecting a small amount of toxin into the muscles of the neck and head. Dosage must be titrated for each individual. Any benefits resulting from botulism toxin tend to wear off after 3 months with a repeat injection required if benefit is to be maintained. Injections are usually done every 3-4 months with maximum effect peak achieved by about 2 or 3 weeks. Botulism toxin is expensive and you should be sure of what costs you will incur resulting from the injection.  The side effects of botulism toxin use for chronic migraine may include:   -Transient, and usually mild, facial weakness with facial injections  -Transient, and usually mild, head or neck weakness with head/neck injections  -Reduction or loss of forehead facial animation due to forehead muscle weakness  -Eyelid drooping  -Dry eye  -Pain at the site of injection or bruising at the site of injection  -Double vision  -Potential unknown long term risks  Contraindications: You should not have Botox if you are pregnant, nursing, allergic to albumin, have an infection, skin condition, or muscle weakness at the site of the injection, or have myasthenia gravis, Lambert-Eaton syndrome, or ALS.  It is also possible that as with any injection, there may be an allergic reaction or no effect from the medication. Reduced effectiveness after repeated injections is sometimes seen and rarely infection at the injection site may  occur. All care will be taken to prevent these side effects. If therapy is given over a long time, atrophy and wasting in the muscle injected may occur. Occasionally the patient's become refractory to treatment because they develop antibodies to the toxin. In this event, therapy needs to be modified.  I have read the above information and consent to the administration of botulism toxin.    BOTOX PROCEDURE NOTE FOR MIGRAINE HEADACHE    Contraindications and precautions discussed with patient(above). Aseptic procedure was observed and patient tolerated procedure. Procedure performed by Dr. Artemio Aly  The condition has existed for more than 6 months, and pt does not have a diagnosis of ALS, Myasthenia Gravis or Lambert-Eaton Syndrome.  Risks and benefits of injections discussed and pt agrees to proceed with the procedure.  Written consent obtained  These injections are medically necessary. Pt  receives good benefits from these injections. These injections do not cause sedations or hallucinations which the oral therapies may cause.  Description of procedure:  The patient was placed in a sitting position. The standard protocol was used for Botox as follows, with 5 units of Botox injected at each site:   -Procerus muscle, midline injection  -Corrugator muscle, bilateral injection  -Frontalis muscle, bilateral injection, with 2 sites each side, medial injection was performed in the upper one third of the frontalis muscle, in the region vertical from the medial inferior edge of the superior orbital rim. The lateral injection was again in the upper one third of the forehead vertically above the lateral limbus of the cornea, 1.5 cm lateral to the medial injection site.  -Temporalis muscle injection, 4  sites, bilaterally. The first injection was 3 cm above the tragus of the ear, second injection site was 1.5 cm to 3 cm up from the first injection site in line with the tragus of the ear. The third  injection site was 1.5-3 cm forward between the first 2 injection sites. The fourth injection site was 1.5 cm posterior to the second injection site.   -Occipitalis muscle injection, 3 sites, bilaterally. The first injection was done one half way between the occipital protuberance and the tip of the mastoid process behind the ear. The second injection site was done lateral and superior to the first, 1 fingerbreadth from the first injection. The third injection site was 1 fingerbreadth superiorly and medially from the first injection site.  -Cervical paraspinal muscle injection, 2 sites, bilateral knee first injection site was 1 cm from the midline of the cervical spine, 3 cm inferior to the lower border of the occipital protuberance. The second injection site was 1.5 cm superiorly and laterally to the first injection site.  -Trapezius muscle injection was performed at 3 sites, bilaterally. The first injection site was in the upper trapezius muscle halfway between the inflection point of the neck, and the acromion. The second injection site was one half way between the acromion and the first injection site. The third injection was done between the first injection site and the inflection point of the neck.   Will return for repeat injection in 3 months.   200 units of Botox was used, 45 Botox not injected was wasted. The patient tolerated the procedure well, there were no complications of the above procedure.

## 2023-09-03 NOTE — Patient Instructions (Signed)
Zavegepant Nasal Spray What is this medication? ZAVEGEPANT (za VE je pant) treats migraines. It works by blocking a substance in the body that causes migraines. It is not used to prevent migraines. This medicine may be used for other purposes; ask your health care provider or pharmacist if you have questions. COMMON BRAND NAME(S): ZAVZPRET What should I tell my care team before I take this medication? They need to know if you have any of these conditions: Kidney disease Liver disease An unusual or allergic reaction to zavegepant, other medications, foods, dyes, or preservatives Pregnant or trying to get pregnant Breast-feeding How should I use this medication? This medication is for use in the nose. Take it as directed on the prescription label. Do not use it more often than directed. Make sure that you are using your nasal spray correctly. Ask your care team if you have any questions. Talk to your care team about the use of this medication in children. Special care may be needed. Overdosage: If you think you have taken too much of this medicine contact a poison control center or emergency room at once. NOTE: This medicine is only for you. Do not share this medicine with others. What if I miss a dose? This does not apply. This medication is not for regular use. It should only be used as needed. What may interact with this medication? Decongestant nasal sprays This medication may affect how other medications work, and other medications may affect the way this medication works. Talk with your care team about all of the medications you take. They may suggest changes to your treatment plan to lower the risk of side effects and to make sure your medications work as intended. This list may not describe all possible interactions. Give your health care provider a list of all the medicines, herbs, non-prescription drugs, or dietary supplements you use. Also tell them if you smoke, drink alcohol, or  use illegal drugs. Some items may interact with your medicine. What should I watch for while using this medication? Visit your care team for regular checks on your progress. Tell your care team if your symptoms do not start to get better or if they get worse. What side effects may I notice from receiving this medication? Side effects that you should report to your care team as soon as possible: Allergic reactions--skin rash, itching, hives, swelling of the face, lips, tongue, or throat Side effects that usually do not require medical attention (report to your care team if they continue or are bothersome): Change in taste Dryness or irritation inside the nose Nausea Vomiting This list may not describe all possible side effects. Call your doctor for medical advice about side effects. You may report side effects to FDA at 1-800-FDA-1088. Where should I keep my medication? Keep out of the reach of children and pets. Store at room temperature between 20 and 25 degrees C (68 and 77 degrees F). Do not freeze. Get rid of any unused medication after the expiration date. To get rid of medications that are no longer needed or have expired: Take the medication to a medication take-back program. Check with your pharmacy or law enforcement to find a location. If you cannot return the medication, ask your pharmacist or care team how to get rid of this medication safely. NOTE: This sheet is a summary. It may not cover all possible information. If you have questions about this medicine, talk to your doctor, pharmacist, or health care provider.  2024 Elsevier/Gold Standard (  2021-11-28 00:00:00) Ketorolac Injection What is this medication? KETOROLAC (kee toe ROLE ak) treats short-term moderate to severe pain. It works by decreasing inflammation. It belongs to a group of medications called NSAIDs. This medicine may be used for other purposes; ask your health care provider or pharmacist if you have  questions. COMMON BRAND NAME(S): Toradol What should I tell my care team before I take this medication? They need to know if you have any of these conditions: Bleeding disorder Coronary artery bypass graft (CABG) within the past 2 weeks Frequently drink alcohol Heart attack Heart disease Heart failure High blood pressure Kidney disease Liver disease Lung or breathing disease, such as asthma or COPD Receiving steroids, such as dexamethasone or prednisone Stomach bleeding Stomach ulcers, other stomach or intestine problems Take medication to treat or prevent blood clots Tobacco use An unusual or allergic reaction to ketorolac, other medications, foods, dyes, or preservatives Pregnant or trying to get pregnant Breastfeeding How should I use this medication? This medication is injected into a vein or muscle. It is given by your care team in a hospital or clinic setting. A special MedGuide will be given to you before each treatment. Be sure to read this information carefully each time. Talk to your care team about the use of this medication in children. Special care may be needed. People 65 years and older may have a stronger reaction and need a smaller dose. Overdosage: If you think you have taken too much of this medicine contact a poison control center or emergency room at once. NOTE: This medicine is only for you. Do not share this medicine with others. What if I miss a dose? This does not apply. This medication is not for regular use. What may interact with this medication? Do not take this medication with any of the following: Aspirin and aspirin-like medications Cidofovir Methotrexate NSAIDs, medications for pain and inflammation, such as ibuprofen or naproxen Pentoxifylline Probenecid This medication may also interact with the following: Alcohol Alendronate Alprazolam Carbamazepine Diuretics Flavocoxid Fluoxetine Ginkgo Lithium Medications for blood pressure, such  as enalapril Medications that affect platelets, such as pentoxifylline Medications that prevent or treat blood clots, such as heparin or warfarin Medications that relax muscles Pemetrexed Phenytoin Thiothixene This list may not describe all possible interactions. Give your health care provider a list of all the medicines, herbs, non-prescription drugs, or dietary supplements you use. Also tell them if you smoke, drink alcohol, or use illegal drugs. Some items may interact with your medicine. What should I watch for while using this medication? Your condition will be monitored carefully while you are receiving this medication. Do not use this medication for more than 5 days. It is only used for short-term treatment of moderate to severe pain. The risk of side effects such as kidney damage and stomach bleeding are higher if used for more than 5 days. Do not take other medications that contain aspirin, ibuprofen, or naproxen with this medication. Side effects such as stomach upset, nausea, or ulcers may be more likely to occur. Many non-prescription medications contain aspirin, ibuprofen, or naproxen. Always read labels carefully. This medication can cause serious ulcers and bleeding in the stomach. It can happen with no warning. Tobacco, alcohol, older age, and poor health can also increase risks. Call your care team right away if you have stomach pain or blood in your vomit or stool. This medication may cause serious skin reactions. They can happen weeks to months after starting the medication. Contact your care  team right away if you notice fevers or flu-like symptoms with a rash. The rash may be red or purple and then turn into blisters or peeling of the skin. You may also notice a red rash with swelling of the face, lips, or lymph nodes in your neck or under your arms. Talk to your care team if you wish to become pregnant or think you might be pregnant. This medication can cause serious birth  defects. This medication does not prevent a heart attack or stroke. This medication may increase the chance of a heart attack or stroke. The chance may increase the longer you use this medication or if you have heart disease. If you take aspirin to prevent a heart attack or stroke, talk to your care team about using this medication. This medication may affect your coordination, reaction time, or judgment. Do not drive or operate machinery until you know how this medication affects you. Sit up or stand slowly to reduce the risk of dizzy or fainting spells. Drinking alcohol with this medication can increase the risk of these side effects. What side effects may I notice from receiving this medication? Side effects that you should report to your care team as soon as possible: Allergic reactions--skin rash, itching, hives, swelling of the face, lips, tongue, or throat Heart attack--pain or tightness in the chest, shoulders, arms, or jaw, nausea, shortness of breath, cold or clammy skin, feeling faint or lightheaded Heart failure--shortness of breath, swelling of ankles, feet, or hands, sudden weight gain, unusual weakness or fatigue Increase in blood pressure Kidney injury--decrease in the amount of urine, swelling of the ankles, hands, or feet Liver injury--right upper belly pain, loss of appetite, nausea, light-colored stool, dark yellow or brown urine, yellowing skin or eyes, unusual weakness or fatigue Low red blood cell count--unusual weakness or fatigue, dizziness, headache, trouble breathing Rash, fever, and swollen lymph nodes Redness, blistering, peeling, or loosening of the skin, including inside the mouth Stomach bleeding--bloody or black, tar-like stools, vomiting blood or brown material that looks like coffee grounds Stroke--sudden numbness or weakness of the face, arm, or leg, trouble speaking, confusion, trouble walking, loss of balance or coordination, dizziness, severe headache, change in  vision Side effects that usually do not require medical attention (report to your care team if they continue or are bothersome): Constipation Diarrhea Dizziness Headache Nausea Stomach pain This list may not describe all possible side effects. Call your doctor for medical advice about side effects. You may report side effects to FDA at 1-800-FDA-1088. Where should I keep my medication? This medication is given in a hospital or clinic. It will not be stored at home. NOTE: This sheet is a summary. It may not cover all possible information. If you have questions about this medicine, talk to your doctor, pharmacist, or health care provider.  2024 Elsevier/Gold Standard (2022-03-10 00:00:00)

## 2023-09-03 NOTE — Progress Notes (Signed)
Botox- 200 units x 1 vial Lot: [id number] Expiration: 11/2025 NDC: 3710-6269-48  Bacteriostatic 0.9% Sodium Chloride- 4 mL  Lot: [id number] Expiration: 07/16/2024 NDC: 3500-9381-82  Dx: X93.716 S/P OR B/B Witnessed by Leeann Must RN

## 2023-09-07 ENCOUNTER — Telehealth: Payer: Self-pay | Admitting: *Deleted

## 2023-09-07 DIAGNOSIS — G43711 Chronic migraine without aura, intractable, with status migrainosus: Secondary | ICD-10-CM

## 2023-09-07 MED ORDER — NERIVIO DEVI: 24 refills | Status: AC

## 2023-09-07 NOTE — Telephone Encounter (Signed)
Hey Ms Sarpong, Dr Lucia Gaskins ordered the [facility] device for you to try for your migraines. It went to a specialty pharmacy in [place] called     They may call you soon or you may even get a call from [facility]. They work together to provide this device. Let us know if you have any questions. They will send you instructions that show you how to use it. Hope you like it!  Toma Copier RN

## 2023-09-07 NOTE — Telephone Encounter (Signed)
-----   Message from Anson Fret sent at 09/06/2023  9:44 AM EST ----- Regarding: please prescribe nerivio Please prescribe nerivio for patient

## 2023-11-09 NOTE — Telephone Encounter (Signed)
 LVM asking for new insurance information.

## 2023-11-10 NOTE — Telephone Encounter (Signed)
 Pulled new [employer] state plan via website, submitted auth via CMM. Key: [id number]

## 2023-11-10 NOTE — Telephone Encounter (Addendum)
 Pt will continue to fill through [facility]  Auth#: [id number] (11/10/23-11/09/24)

## 2023-11-26 ENCOUNTER — Ambulatory Visit: Payer: BC Managed Care – PPO | Admitting: Neurology

## 2023-11-26 DIAGNOSIS — G43711 Chronic migraine without aura, intractable, with status migrainosus: Secondary | ICD-10-CM | POA: Diagnosis not present

## 2023-11-26 DIAGNOSIS — G43009 Migraine without aura, not intractable, without status migrainosus: Secondary | ICD-10-CM

## 2023-11-26 DIAGNOSIS — G43E19 Chronic migraine with aura, intractable, without status migrainosus: Secondary | ICD-10-CM

## 2023-11-26 MED ORDER — ONABOTULINUMTOXINA 200 UNITS IJ SOLR
155.0000 [IU] | Freq: Once | INTRAMUSCULAR | Status: AC
  Administered 2023-11-26: 155 [IU] via INTRAMUSCULAR

## 2023-11-26 MED ORDER — NURTEC 75 MG PO TBDP: 75.0000 mg | ORAL_TABLET | Freq: Every day | ORAL | 11 refills | Status: AC | PRN

## 2023-11-26 NOTE — Progress Notes (Signed)
 Consent Form Botulism Toxin Injection For Chronic Migraine   11/26/2023: Daily migraines at baseline and she is improved 50% migraine freq with the botox but still with chronic migraines(15 migraines days a month) and also uses Emgality for the remainder which has reduced her migraine burden to 6 only a month, the combination working exceedingly well. She is using the toradol which is very helpful as is the timolol drops acutely. The nurtec worked very well acutely. She now has 6 moderate to severe migraine days a month and < 10 total headache days a month. Prescribe nurtec. Patient feels that her migraines cause her jaw to ache in her jaw aching also can cause her migraines to worsen it is a trigger for migraines included 5 units in each masseter to see if that helps with migraine severity.   09/03/2023 +a. Gave zavzpret and toradol. Ondansetron and phenergan for when vomiting not to take together. Discussed nerivio try and prescribed.   Reviewed orally with patient, additionally signature is on file:  Botulism toxin has been approved by the Federal drug administration for treatment of chronic migraine. Botulism toxin does not cure chronic migraine and it may not be effective in some patients.  The administration of botulism toxin is accomplished by injecting a small amount of toxin into the muscles of the neck and head. Dosage must be titrated for each individual. Any benefits resulting from botulism toxin tend to wear off after 3 months with a repeat injection required if benefit is to be maintained. Injections are usually done every 3-4 months with maximum effect peak achieved by about 2 or 3 weeks. Botulism toxin is expensive and you should be sure of what costs you will incur resulting from the injection.  The side effects of botulism toxin use for chronic migraine may include:   -Transient, and usually mild, facial weakness with facial injections  -Transient, and usually mild, head or neck  weakness with head/neck injections  -Reduction or loss of forehead facial animation due to forehead muscle weakness  -Eyelid drooping  -Dry eye  -Pain at the site of injection or bruising at the site of injection  -Double vision  -Potential unknown long term risks  Contraindications: You should not have Botox if you are pregnant, nursing, allergic to albumin, have an infection, skin condition, or muscle weakness at the site of the injection, or have myasthenia gravis, Lambert-Eaton syndrome, or ALS.  It is also possible that as with any injection, there may be an allergic reaction or no effect from the medication. Reduced effectiveness after repeated injections is sometimes seen and rarely infection at the injection site may occur. All care will be taken to prevent these side effects. If therapy is given over a long time, atrophy and wasting in the muscle injected may occur. Occasionally the patient's become refractory to treatment because they develop antibodies to the toxin. In this event, therapy needs to be modified.  I have read the above information and consent to the administration of botulism toxin.    BOTOX PROCEDURE NOTE FOR MIGRAINE HEADACHE    Contraindications and precautions discussed with patient(above). Aseptic procedure was observed and patient tolerated procedure. Procedure performed by Dr. Artemio Aly  The condition has existed for more than 6 months, and pt does not have a diagnosis of ALS, Myasthenia Gravis or Lambert-Eaton Syndrome.  Risks and benefits of injections discussed and pt agrees to proceed with the procedure.  Written consent obtained  These injections are medically necessary. Pt  receives  good benefits from these injections. These injections do not cause sedations or hallucinations which the oral therapies may cause.  Description of procedure:  The patient was placed in a sitting position. The standard protocol was used for Botox as follows, with 5 units  of Botox injected at each site:   -Procerus muscle, midline injection  -Corrugator muscle, bilateral injection  -Frontalis muscle, bilateral injection, with 2 sites each side, medial injection was performed in the upper one third of the frontalis muscle, in the region vertical from the medial inferior edge of the superior orbital rim. The lateral injection was again in the upper one third of the forehead vertically above the lateral limbus of the cornea, 1.5 cm lateral to the medial injection site.  -Temporalis muscle injection, 4 sites, bilaterally. The first injection was 3 cm above the tragus of the ear, second injection site was 1.5 cm to 3 cm up from the first injection site in line with the tragus of the ear. The third injection site was 1.5-3 cm forward between the first 2 injection sites. The fourth injection site was 1.5 cm posterior to the second injection site.   -Occipitalis muscle injection, 3 sites, bilaterally. The first injection was done one half way between the occipital protuberance and the tip of the mastoid process behind the ear. The second injection site was done lateral and superior to the first, 1 fingerbreadth from the first injection. The third injection site was 1 fingerbreadth superiorly and medially from the first injection site.  -Cervical paraspinal muscle injection, 2 sites, bilateral knee first injection site was 1 cm from the midline of the cervical spine, 3 cm inferior to the lower border of the occipital protuberance. The second injection site was 1.5 cm superiorly and laterally to the first injection site.  -Trapezius muscle injection was performed at 3 sites, bilaterally. The first injection site was in the upper trapezius muscle halfway between the inflection point of the neck, and the acromion. The second injection site was one half way between the acromion and the first injection site. The third injection was done between the first injection site and the  inflection point of the neck.   Will return for repeat injection in 3 months.   200 units of Botox was used, 45 Botox not injected was wasted. The patient tolerated the procedure well, there were no complications of the above procedure.

## 2023-11-26 NOTE — Progress Notes (Signed)
 Botox- 200 units x 1 vial Lot: [id number] Expiration: 02/2026 NDC: 4540-9811-91  Bacteriostatic 0.9% Sodium Chloride- * mL  Lot: [id number] Expiration: 07/16/2024 NDC: 6213-0865-78  Dx: I69.629 S/P Witnessed by Lennie Muckle, RN

## 2023-12-16 ENCOUNTER — Telehealth: Payer: Self-pay | Admitting: Pharmacy Technician

## 2023-12-16 ENCOUNTER — Other Ambulatory Visit (HOSPITAL_COMMUNITY): Payer: Self-pay

## 2023-12-16 NOTE — Telephone Encounter (Signed)
 Pharmacy Patient Advocate Encounter   Received notification from CoverMyMeds that prior authorization for Nurtec 75MG  dispersible tablets is required/requested.   Insurance verification completed.   The patient is insured through [facility] .   Per test claim: PA required; PA submitted to above mentioned insurance via CoverMyMeds Key/confirmation #/EOC [id number] Status is pending

## 2023-12-17 ENCOUNTER — Other Ambulatory Visit (HOSPITAL_COMMUNITY): Payer: Self-pay

## 2023-12-17 NOTE — Telephone Encounter (Signed)
 Pharmacy Patient Advocate Encounter  Received notification from [facility] that Prior Authorization for Nurtec 75MG  dispersible tablets has been APPROVED from 12/16/2023 to 12/15/2024. Unable to obtain price due to refill too soon rejection, last fill date 12/16/2023 next available fill date4/25/2025   PA #/Case ID/Reference #: PA Case ID #: [id number]

## 2024-01-26 MED ORDER — ONABOTULINUMTOXINA 200 UNITS IJ SOLR: INTRAMUSCULAR | 3 refills | Status: AC

## 2024-01-26 NOTE — Addendum Note (Signed)
 Addended by: Randi Buster on: 01/26/2024 08:51 AM   Modules accepted: Orders

## 2024-01-26 NOTE — Telephone Encounter (Signed)
 Refill sent.

## 2024-01-26 NOTE — Telephone Encounter (Signed)
 Please send refills to [facility], thank you!

## 2024-01-28 ENCOUNTER — Other Ambulatory Visit: Payer: Self-pay

## 2024-01-28 DIAGNOSIS — G43711 Chronic migraine without aura, intractable, with status migrainosus: Secondary | ICD-10-CM

## 2024-01-28 MED ORDER — EMGALITY 120 MG/ML ~~LOC~~ SOAJ: 1.0000 | SUBCUTANEOUS | 1 refills | Status: DC

## 2024-02-01 ENCOUNTER — Other Ambulatory Visit: Payer: Self-pay | Admitting: *Deleted

## 2024-02-01 DIAGNOSIS — G43711 Chronic migraine without aura, intractable, with status migrainosus: Secondary | ICD-10-CM

## 2024-02-01 MED ORDER — EMGALITY 120 MG/ML ~~LOC~~ SOAJ: 120.0000 mg | SUBCUTANEOUS | 1 refills | Status: DC

## 2024-02-02 ENCOUNTER — Telehealth: Payer: Self-pay | Admitting: *Deleted

## 2024-02-02 NOTE — Telephone Encounter (Signed)
 Received a fax from [facility] [phone] asking for Zonisamide  refill but this was d/c'd from pt's [facility] by Dr Tresia Fruit back in December 2024. I talked to the patient. She states she had discussed titration off this with Dr Tresia Fruit. She was at 400 mg and is now at 200 mg dose. She will discuss further with Dr Tresia Fruit at next botox  on 02/17/24. Pt states she doesn't need a refill. I added this back to pt's [facility] for now with note.

## 2024-02-18 ENCOUNTER — Ambulatory Visit: Admitting: Neurology

## 2024-02-18 VITALS — BP 122/75 | HR 72

## 2024-02-18 DIAGNOSIS — G43711 Chronic migraine without aura, intractable, with status migrainosus: Secondary | ICD-10-CM | POA: Diagnosis not present

## 2024-02-18 DIAGNOSIS — R519 Headache, unspecified: Secondary | ICD-10-CM

## 2024-02-18 DIAGNOSIS — R400 Somnolence: Secondary | ICD-10-CM

## 2024-02-18 MED ORDER — ONABOTULINUMTOXINA 200 UNITS IJ SOLR
155.0000 [IU] | Freq: Once | INTRAMUSCULAR | Status: AC
  Administered 2024-02-18: 155 [IU] via INTRAMUSCULAR

## 2024-02-18 NOTE — Progress Notes (Signed)
 Consent Form Botulism Toxin Injection For Chronic Migraine   02/18/2024: Doing excellent on botox  > 50% improvement in freq and severity of migraines and she is very thankful. morning headaches, significant fatigue,pbesity, waks up before getting out of bed or a headache will wake her up during sleep. Sleep referral. ESS 16. Absolutely lovely patient.  Orders Placed This Encounter  Procedures   Ambulatory referral to Sleep Studies     11/26/2023: Daily migraines at baseline and she is improved 50% migraine freq with the botox  but still with chronic migraines(15 migraines days a month) and also uses Emgality  for the remainder which has reduced her migraine burden to 6 only a month, the combination working exceedingly well. She is using the toradol  which is very helpful as is the timolol  drops acutely. The nurtec worked very well acutely. She now has 6 moderate to severe migraine days a month and < 10 total headache days a month. Prescribe nurtec. Patient feels that her migraines cause her jaw to ache in her jaw aching also can cause her migraines to worsen it is a trigger for migraines included 5 units in each masseter to see if that helps with migraine severity.   09/03/2023 +a. Gave zavzpret  and toradol . Ondansetron  and phenergan  for when vomiting not to take together. Discussed nerivio try and prescribed.   Reviewed orally with patient, additionally signature is on file:  Botulism toxin has been approved by the Federal drug administration for treatment of chronic migraine. Botulism toxin does not cure chronic migraine and it may not be effective in some patients.  The administration of botulism toxin is accomplished by injecting a small amount of toxin into the muscles of the neck and head. Dosage must be titrated for each individual. Any benefits resulting from botulism toxin tend to wear off after 3 months with a repeat injection required if benefit is to be maintained. Injections are  usually done every 3-4 months with maximum effect peak achieved by about 2 or 3 weeks. Botulism toxin is expensive and you should be sure of what costs you will incur resulting from the injection.  The side effects of botulism toxin use for chronic migraine may include:   -Transient, and usually mild, facial weakness with facial injections  -Transient, and usually mild, head or neck weakness with head/neck injections  -Reduction or loss of forehead facial animation due to forehead muscle weakness  -Eyelid drooping  -Dry eye  -Pain at the site of injection or bruising at the site of injection  -Double vision  -Potential unknown long term risks  Contraindications: You should not have Botox  if you are pregnant, nursing, allergic to albumin, have an infection, skin condition, or muscle weakness at the site of the injection, or have myasthenia gravis, Lambert-Eaton syndrome, or ALS.  It is also possible that as with any injection, there may be an allergic reaction or no effect from the medication. Reduced effectiveness after repeated injections is sometimes seen and rarely infection at the injection site may occur. All care will be taken to prevent these side effects. If therapy is given over a long time, atrophy and wasting in the muscle injected may occur. Occasionally the patient's become refractory to treatment because they develop antibodies to the toxin. In this event, therapy needs to be modified.  I have read the above information and consent to the administration of botulism toxin.    BOTOX  PROCEDURE NOTE FOR MIGRAINE HEADACHE    Contraindications and precautions discussed with patient(above). Aseptic procedure  was observed and patient tolerated procedure. Procedure performed by Dr. Criselda Dolly  The condition has existed for more than 6 months, and pt does not have a diagnosis of ALS, Myasthenia Gravis or Lambert-Eaton Syndrome.  Risks and benefits of injections discussed and pt agrees  to proceed with the procedure.  Written consent obtained  These injections are medically necessary. Pt  receives good benefits from these injections. These injections do not cause sedations or hallucinations which the oral therapies may cause.  Description of procedure:  The patient was placed in a sitting position. The standard protocol was used for Botox  as follows, with 5 units of Botox  injected at each site:   -Procerus muscle, midline injection  -Corrugator muscle, bilateral injection  -Frontalis muscle, bilateral injection, with 2 sites each side, medial injection was performed in the upper one third of the frontalis muscle, in the region vertical from the medial inferior edge of the superior orbital rim. The lateral injection was again in the upper one third of the forehead vertically above the lateral limbus of the cornea, 1.5 cm lateral to the medial injection site.  -Temporalis muscle injection, 4 sites, bilaterally. The first injection was 3 cm above the tragus of the ear, second injection site was 1.5 cm to 3 cm up from the first injection site in line with the tragus of the ear. The third injection site was 1.5-3 cm forward between the first 2 injection sites. The fourth injection site was 1.5 cm posterior to the second injection site.   -Occipitalis muscle injection, 3 sites, bilaterally. The first injection was done one half way between the occipital protuberance and the tip of the mastoid process behind the ear. The second injection site was done lateral and superior to the first, 1 fingerbreadth from the first injection. The third injection site was 1 fingerbreadth superiorly and medially from the first injection site.  -Cervical paraspinal muscle injection, 2 sites, bilateral knee first injection site was 1 cm from the midline of the cervical spine, 3 cm inferior to the lower border of the occipital protuberance. The second injection site was 1.5 cm superiorly and laterally to  the first injection site.  -Trapezius muscle injection was performed at 3 sites, bilaterally. The first injection site was in the upper trapezius muscle halfway between the inflection point of the neck, and the acromion. The second injection site was one half way between the acromion and the first injection site. The third injection was done between the first injection site and the inflection point of the neck.   Will return for repeat injection in 3 months.   200 units of Botox  was used, 45 Botox  not injected was wasted. The patient tolerated the procedure well, there were no complications of the above procedure.

## 2024-02-18 NOTE — Progress Notes (Signed)
 Botox - 200 units x 1 vial Lot: [id number] Expiration: 06/2026 NDC: 3664-4034-74  Bacteriostatic 0.9% Sodium Chloride -  4 mL  Lot: [id number] Expiration: 02/2025 NDC: 8756-4332-95  Dx: J88.416 S/P  Witnessed by Lurena Sally

## 2024-03-21 ENCOUNTER — Ambulatory Visit: Admitting: Neurology

## 2024-03-21 ENCOUNTER — Encounter: Payer: Self-pay | Admitting: Neurology

## 2024-03-21 VITALS — BP 117/78 | HR 66 | Ht 67.0 in | Wt 202.0 lb

## 2024-03-21 DIAGNOSIS — R519 Headache, unspecified: Secondary | ICD-10-CM

## 2024-03-21 DIAGNOSIS — R400 Somnolence: Secondary | ICD-10-CM

## 2024-03-21 DIAGNOSIS — G43711 Chronic migraine without aura, intractable, with status migrainosus: Secondary | ICD-10-CM

## 2024-03-21 DIAGNOSIS — F4310 Post-traumatic stress disorder, unspecified: Secondary | ICD-10-CM | POA: Diagnosis not present

## 2024-03-21 MED ORDER — PROMETHAZINE HCL 12.5 MG PO TABS: 12.5000 mg | ORAL_TABLET | Freq: Four times a day (QID) | ORAL | 0 refills | Status: AC | PRN
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
## 2024-04-05 ENCOUNTER — Telehealth: Payer: Self-pay | Admitting: Neurology

## 2024-04-05 NOTE — Telephone Encounter (Signed)
 04/05/24 LVM EE  7/10:lvm-mla 7/10: [employer]- no auth req-mla *Requesting female tech due to past hx*

## 2024-05-07 ENCOUNTER — Other Ambulatory Visit: Payer: Self-pay | Admitting: Neurology

## 2024-05-07 DIAGNOSIS — G43711 Chronic migraine without aura, intractable, with status migrainosus: Secondary | ICD-10-CM

## 2024-05-07 DIAGNOSIS — R52 Pain, unspecified: Secondary | ICD-10-CM

## 2024-05-17 ENCOUNTER — Ambulatory Visit: Admitting: Neurology

## 2024-05-17 VITALS — BP 159/98 | HR 65

## 2024-05-17 DIAGNOSIS — G43711 Chronic migraine without aura, intractable, with status migrainosus: Secondary | ICD-10-CM | POA: Diagnosis not present

## 2024-05-17 MED ORDER — ONABOTULINUMTOXINA 200 UNITS IJ SOLR
155.0000 [IU] | Freq: Once | INTRAMUSCULAR | Status: AC
  Administered 2024-05-17: 155 [IU] via INTRAMUSCULAR

## 2024-05-17 NOTE — Progress Notes (Signed)
 Botox - 200 units x 1 vial Lot: [id number] Expiration: 11/2026 NDC: 9976-6078-97  Bacteriostatic 0.9% Sodium Chloride - 4 mL  Lot: [id number] Expiration: 07/15/2025 NDC: 9590-8033-97  Dx: H56.288 S/P  Witnessed by Nena GRADE RN Botox  consent signed

## 2024-05-17 NOTE — Progress Notes (Signed)
 Consent Form Botulism Toxin Injection For Chronic Migraine   05/17/2024: Doing excellent on botox  > 50% improvement in freq and severity of migraines and she is very thankful.Patient feels that her migraines cause her jaw to ache in her jaw aching also can cause her migraines to worsen it is a trigger for migraines included 5 units in each masseter to see if that helps with migraine severity.  02/18/2024: Doing excellent on botox  > 50% improvement in freq and severity of migraines and she is very thankful.Patient feels that her migraines cause her jaw to ache in her jaw aching also can cause her migraines to worsen it is a trigger for migraines included 5 units in each masseter to see if that helps with migraine severity.   No orders of the defined types were placed in this encounter.    11/26/2023: Daily migraines at baseline and she is improved 50% migraine freq with the botox  but still with chronic migraines(15 migraines days a month) and also uses Emgality  for the remainder which has reduced her migraine burden to 6 only a month, the combination working exceedingly well. She is using the toradol  which is very helpful as is the timolol  drops acutely. The nurtec worked very well acutely. She now has 6 moderate to severe migraine days a month and < 10 total headache days a month. Prescribe nurtec. Patient feels that her migraines cause her jaw to ache in her jaw aching also can cause her migraines to worsen it is a trigger for migraines included 5 units in each masseter to see if that helps with migraine severity.   09/03/2023 +a. Gave zavzpret  and toradol . Ondansetron  and phenergan  for when vomiting not to take together. Discussed nerivio try and prescribed.   Reviewed orally with patient, additionally signature is on file:  Botulism toxin has been approved by the Federal drug administration for treatment of chronic migraine. Botulism toxin does not cure chronic migraine and it may not be  effective in some patients.  The administration of botulism toxin is accomplished by injecting a small amount of toxin into the muscles of the neck and head. Dosage must be titrated for each individual. Any benefits resulting from botulism toxin tend to wear off after 3 months with a repeat injection required if benefit is to be maintained. Injections are usually done every 3-4 months with maximum effect peak achieved by about 2 or 3 weeks. Botulism toxin is expensive and you should be sure of what costs you will incur resulting from the injection.  The side effects of botulism toxin use for chronic migraine may include:   -Transient, and usually mild, facial weakness with facial injections  -Transient, and usually mild, head or neck weakness with head/neck injections  -Reduction or loss of forehead facial animation due to forehead muscle weakness  -Eyelid drooping  -Dry eye  -Pain at the site of injection or bruising at the site of injection  -Double vision  -Potential unknown long term risks  Contraindications: You should not have Botox  if you are pregnant, nursing, allergic to albumin, have an infection, skin condition, or muscle weakness at the site of the injection, or have myasthenia gravis, Lambert-Eaton syndrome, or ALS.  It is also possible that as with any injection, there may be an allergic reaction or no effect from the medication. Reduced effectiveness after repeated injections is sometimes seen and rarely infection at the injection site may occur. All care will be taken to prevent these side effects. If therapy is  given over a long time, atrophy and wasting in the muscle injected may occur. Occasionally the patient's become refractory to treatment because they develop antibodies to the toxin. In this event, therapy needs to be modified.  I have read the above information and consent to the administration of botulism toxin.    BOTOX  PROCEDURE NOTE FOR MIGRAINE  HEADACHE    Contraindications and precautions discussed with patient(above). Aseptic procedure was observed and patient tolerated procedure. Procedure performed by Dr. Andree Epp  The condition has existed for more than 6 months, and pt does not have a diagnosis of ALS, Myasthenia Gravis or Lambert-Eaton Syndrome.  Risks and benefits of injections discussed and pt agrees to proceed with the procedure.  Written consent obtained  These injections are medically necessary. Pt  receives good benefits from these injections. These injections do not cause sedations or hallucinations which the oral therapies may cause.  Description of procedure:  The patient was placed in a sitting position. The standard protocol was used for Botox  as follows, with 5 units of Botox  injected at each site:   -Procerus muscle, midline injection  -Corrugator muscle, bilateral injection  -Frontalis muscle, bilateral injection, with 2 sites each side, medial injection was performed in the upper one third of the frontalis muscle, in the region vertical from the medial inferior edge of the superior orbital rim. The lateral injection was again in the upper one third of the forehead vertically above the lateral limbus of the cornea, 1.5 cm lateral to the medial injection site.  -Temporalis muscle injection, 4 sites, bilaterally. The first injection was 3 cm above the tragus of the ear, second injection site was 1.5 cm to 3 cm up from the first injection site in line with the tragus of the ear. The third injection site was 1.5-3 cm forward between the first 2 injection sites. The fourth injection site was 1.5 cm posterior to the second injection site.   -Occipitalis muscle injection, 3 sites, bilaterally. The first injection was done one half way between the occipital protuberance and the tip of the mastoid process behind the ear. The second injection site was done lateral and superior to the first, 1 fingerbreadth from the first  injection. The third injection site was 1 fingerbreadth superiorly and medially from the first injection site.  -Cervical paraspinal muscle injection, 2 sites, bilateral knee first injection site was 1 cm from the midline of the cervical spine, 3 cm inferior to the lower border of the occipital protuberance. The second injection site was 1.5 cm superiorly and laterally to the first injection site.  -Trapezius muscle injection was performed at 3 sites, bilaterally. The first injection site was in the upper trapezius muscle halfway between the inflection point of the neck, and the acromion. The second injection site was one half way between the acromion and the first injection site. The third injection was done between the first injection site and the inflection point of the neck.   Will return for repeat injection in 3 months.   200 units of Botox  was used, 35 Botox  not injected was wasted. The patient tolerated the procedure well, there were no complications of the above procedure.

## 2024-05-30 ENCOUNTER — Other Ambulatory Visit: Payer: Self-pay | Admitting: Neurology

## 2024-05-30 DIAGNOSIS — G43711 Chronic migraine without aura, intractable, with status migrainosus: Secondary | ICD-10-CM

## 2024-07-18 ENCOUNTER — Telehealth: Payer: Self-pay

## 2024-07-18 NOTE — Telephone Encounter (Signed)
 Pharmacy Patient Advocate Encounter   Received notification from CoverMyMeds that prior authorization for Emgality  120MG /ML auto-injectors (migraine) is required/requested.   Insurance verification completed.   The patient is insured through [facility].   Per test claim: PA required; PA submitted to above mentioned insurance via Latent Key/confirmation #/EOC [id number] Status is pending

## 2024-07-19 ENCOUNTER — Other Ambulatory Visit (HOSPITAL_COMMUNITY): Payer: Self-pay

## 2024-07-19 NOTE — Telephone Encounter (Signed)
 Pharmacy Patient Advocate Encounter  Received notification from [employer] that Prior Authorization for Emgality  120MG /ML auto-injectors (migraine) has been APPROVED from 07-18-2024 to 07-18-2025   PA #/Case ID/Reference #: [id number]

## 2024-07-20 ENCOUNTER — Telehealth: Payer: Self-pay | Admitting: Neurology

## 2024-07-20 NOTE — Telephone Encounter (Signed)
 Pt asked to cx appointment, not needed, study not done

## 2024-07-25 ENCOUNTER — Ambulatory Visit: Admitting: Neurology

## 2024-07-29 ENCOUNTER — Telehealth: Payer: Self-pay | Admitting: Adult Health

## 2024-07-29 NOTE — Telephone Encounter (Signed)
 Bree from [facility]  called to set up  botulinum toxin Type A  (BOTOX ) 200 units injection  Delivery   Botox  Delivery  Date 08-03-2024 Quantity of 1 single dose valve  200 unit 84 day supply  No seg Require

## 2024-08-09 ENCOUNTER — Telehealth: Payer: Self-pay | Admitting: Neurology

## 2024-08-09 ENCOUNTER — Ambulatory Visit: Admitting: Neurology

## 2024-08-09 ENCOUNTER — Encounter: Payer: Self-pay | Admitting: Adult Health

## 2024-08-09 ENCOUNTER — Ambulatory Visit: Admitting: Adult Health

## 2024-08-09 VITALS — BP 147/99 | HR 58

## 2024-08-09 DIAGNOSIS — G43711 Chronic migraine without aura, intractable, with status migrainosus: Secondary | ICD-10-CM | POA: Diagnosis not present

## 2024-08-09 MED ORDER — ONABOTULINUMTOXINA 200 UNITS IJ SOLR
155.0000 [IU] | Freq: Once | INTRAMUSCULAR | Status: AC
  Administered 2024-08-09: 155 [IU] via INTRAMUSCULAR

## 2024-08-09 NOTE — Telephone Encounter (Signed)
 Appointment details confirmed

## 2024-08-09 NOTE — Progress Notes (Signed)
 Update 08/09/2024 JM: patient returns for repeat botox . Prior injection 05/17/2024 with Dr. Ines. Reports migraines remain very well controlled on botox  and Emgality .  She has had some wearing off as Botox  is wearing off and also is almost due for repeat Emgality  injection.  She will use Toradol , Nurtec, timolol  or Zomig  as needed with benefit, will only use 1 at a time, will switch up regimen which works well for her. Use of zofran  as needed.        Consent Form Botulism Toxin Injection For Chronic Migraine    Reviewed orally with patient, additionally signature is on file:  Botulism toxin has been approved by the Federal drug administration for treatment of chronic migraine. Botulism toxin does not cure chronic migraine and it may not be effective in some patients.  The administration of botulism toxin is accomplished by injecting a small amount of toxin into the muscles of the neck and head. Dosage must be titrated for each individual. Any benefits resulting from botulism toxin tend to wear off after 3 months with a repeat injection required if benefit is to be maintained. Injections are usually done every 3-4 months with maximum effect peak achieved by about 2 or 3 weeks. Botulism toxin is expensive and you should be sure of what costs you will incur resulting from the injection.  The side effects of botulism toxin use for chronic migraine may include:   -Transient, and usually mild, facial weakness with facial injections  -Transient, and usually mild, head or neck weakness with head/neck injections  -Reduction or loss of forehead facial animation due to forehead muscle weakness  -Eyelid drooping  -Dry eye  -Pain at the site of injection or bruising at the site of injection  -Double vision  -Potential unknown long term risks   Contraindications: You should not have Botox  if you are pregnant, nursing, allergic to albumin, have an infection, skin condition, or muscle weakness at  the site of the injection, or have myasthenia gravis, Lambert-Eaton syndrome, or ALS.  It is also possible that as with any injection, there may be an allergic reaction or no effect from the medication. Reduced effectiveness after repeated injections is sometimes seen and rarely infection at the injection site may occur. All care will be taken to prevent these side effects. If therapy is given over a long time, atrophy and wasting in the muscle injected may occur. Occasionally the patient's become refractory to treatment because they develop antibodies to the toxin. In this event, therapy needs to be modified.  I have read the above information and consent to the administration of botulism toxin.    BOTOX  PROCEDURE NOTE FOR MIGRAINE HEADACHE  Contraindications and precautions discussed with patient(above). Aseptic procedure was observed and patient tolerated procedure. Procedure performed by Harlene Bogaert, AGNP-BC.   The condition has existed for more than 6 months, and pt does not have a diagnosis of ALS, Myasthenia Gravis or Lambert-Eaton Syndrome.  Risks and benefits of injections discussed and pt agrees to proceed with the procedure.  Written consent obtained  These injections are medically necessary. Pt  receives good benefits from these injections. These injections do not cause sedations or hallucinations which the oral therapies may cause.   Description of procedure:  The patient was placed in a sitting position. The standard protocol was used for Botox  as follows, with 5 units of Botox  injected at each site:  -Procerus muscle, midline injection  -Corrugator muscle, bilateral injection  -Frontalis muscle, bilateral injection,  with 2 sites each side, medial injection was performed in the upper one third of the frontalis muscle, in the region vertical from the medial inferior edge of the superior orbital rim. The lateral injection was again in the upper one third of the forehead  vertically above the lateral limbus of the cornea, 1.5 cm lateral to the medial injection site.  -Temporalis muscle injection, 4 sites, bilaterally. The first injection was 3 cm above the tragus of the ear, second injection site was 1.5 cm to 3 cm up from the first injection site in line with the tragus of the ear. The third injection site was 1.5-3 cm forward between the first 2 injection sites. The fourth injection site was 1.5 cm posterior to the second injection site. 5th site laterally in the temporalis  muscleat the level of the outer canthus.  -Occipitalis muscle injection, 3 sites, bilaterally. The first injection was done one half way between the occipital protuberance and the tip of the mastoid process behind the ear. The second injection site was done lateral and superior to the first, 1 fingerbreadth from the first injection. The third injection site was 1 fingerbreadth superiorly and medially from the first injection site.  -Cervical paraspinal muscle injection, 2 sites, bilaterally. The first injection site was 1 cm from the midline of the cervical spine, 3 cm inferior to the lower border of the occipital protuberance. The second injection site was 1.5 cm superiorly and laterally to the first injection site.  -Trapezius muscle injection was performed at 3 sites, bilaterally. The first injection site was in the upper trapezius muscle halfway between the inflection point of the neck, and the acromion. The second injection site was one half way between the acromion and the first injection site. The third injection was done between the first injection site and the inflection point of the neck.    A total of 200 units of Botox  was prepared, 155 units of Botox  was injected as documented above, any Botox  not injected was wasted. The patient tolerated the procedure well, there were no complications of the above procedure.   Harlene Bogaert, AGNP-BC  [facility] [address] [place], [place] [address]  Phone [phone] Fax [phone] Note: This document was prepared with digital dictation and possible smart phrase technology. Any transcriptional errors that result from this process are unintentional.

## 2024-08-09 NOTE — Progress Notes (Signed)
 Botox - 200 units x 1 vial Lot: [id number] Expiration: 3/28 NDC: 9976-6078-97  Bacteriostatic 0.9% Sodium Chloride - 4 mL  Lot: [id number] Expiration: 07/15/25 NDC: 9590-8033-97  Dx: H56.288 S/P  Witnessed by [employer]

## 2024-10-12 ENCOUNTER — Telehealth: Payer: Self-pay | Admitting: Adult Health

## 2024-10-12 NOTE — Telephone Encounter (Signed)
 Auth#: [id number] (10/12/24-10/12/25)

## 2024-11-02 ENCOUNTER — Ambulatory Visit: Admitting: Adult Health

## 2025-02-07 ENCOUNTER — Ambulatory Visit: Admitting: Neurology
